# Patient Record
Sex: Male | Born: 1980 | Race: Black or African American | Hispanic: No | Marital: Single | Smoking: Never smoker
Health system: Southern US, Community
[De-identification: ages and names within clinical notes are randomized; demographics above are authoritative.]

## PROBLEM LIST (undated history)

## (undated) DIAGNOSIS — N469 Male infertility, unspecified: Secondary | ICD-10-CM

## (undated) HISTORY — DX: Male infertility, unspecified: N46.9

## (undated) HISTORY — PX: OTHER SURGICAL HISTORY: SHX169

---

## 2001-07-08 ENCOUNTER — Emergency Department (HOSPITAL_COMMUNITY): Admission: EM | Admit: 2001-07-08 | Discharge: 2001-07-09 | Payer: Self-pay | Admitting: Emergency Medicine

## 2005-10-16 ENCOUNTER — Emergency Department (HOSPITAL_COMMUNITY): Admission: EM | Admit: 2005-10-16 | Discharge: 2005-10-16 | Payer: Self-pay | Admitting: Emergency Medicine

## 2007-12-09 IMAGING — CR DG ELBOW COMPLETE 3+V*R*
2 series · 2 of 2 positions shown · non-contrast
Comparison: none

CLINICAL DATA: Fell playing basketball.  Unable to extend.  Soft tissue swelling.  
 RIGHT ELBOW ? 4 VIEW:

[view not recorded (1 of 2)]
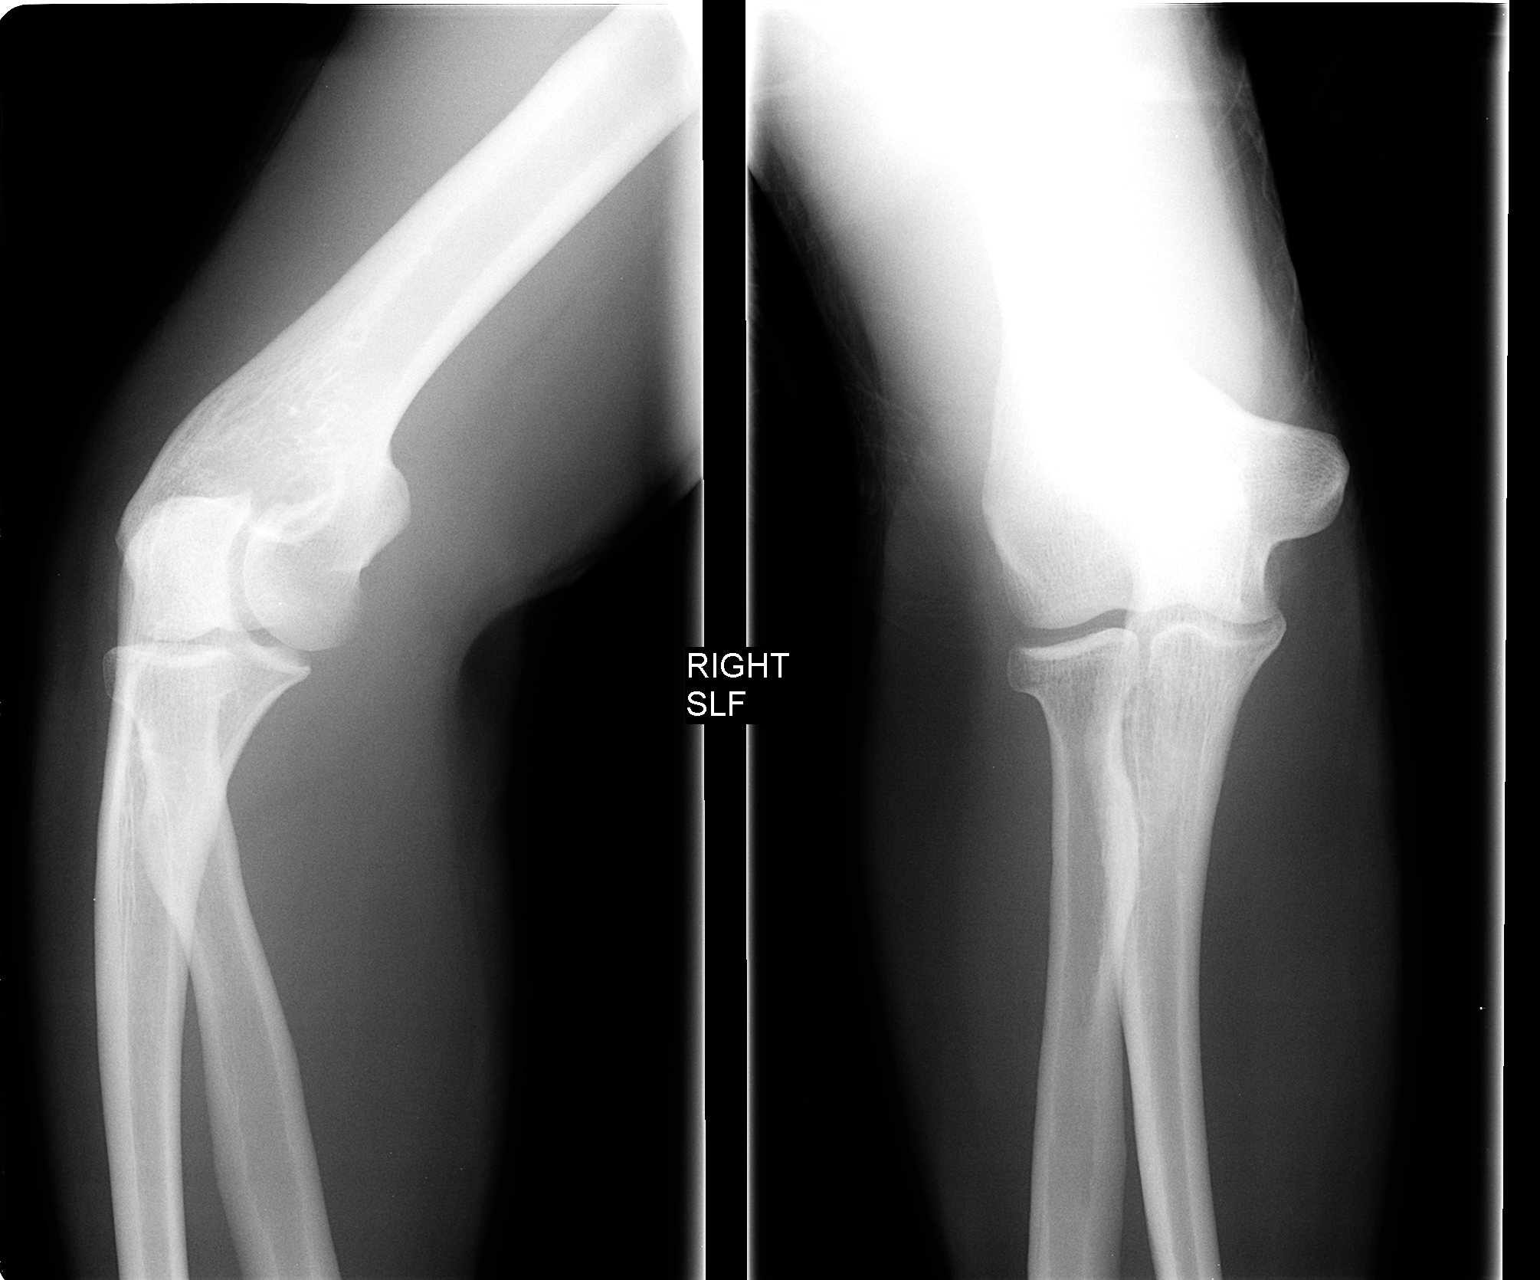

[view not recorded (2 of 2)]
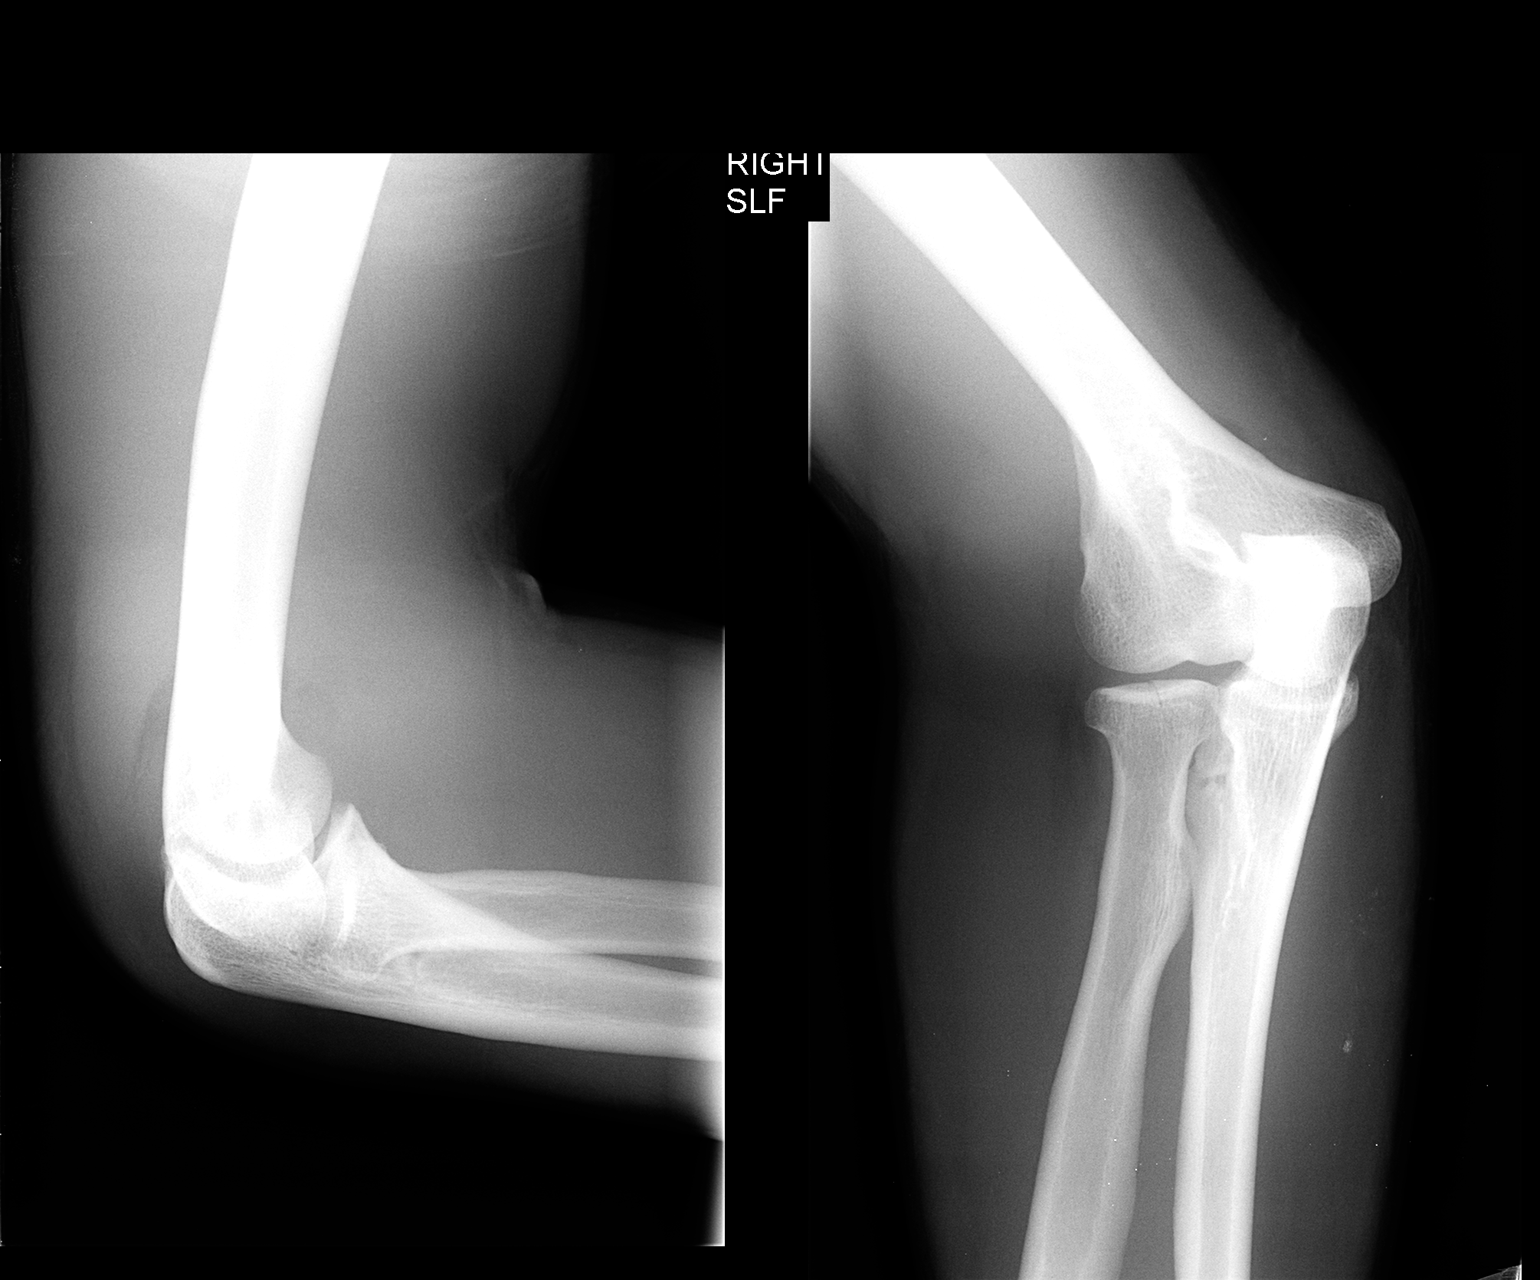

[2 of 2 positions shown; findings below may reference images not displayed]

FINDINGS: There is a nondisplaced radial head fracture observed on the AP view.  There is a large joint effusion with elevation of the fat pads.  Ulna and distal humerus appear intact.
IMPRESSION: Nondisplaced radial head fracture with large joint effusion.

## 2009-09-17 ENCOUNTER — Ambulatory Visit: Payer: Self-pay | Admitting: Family Medicine

## 2009-09-24 ENCOUNTER — Ambulatory Visit: Payer: Self-pay | Admitting: Family Medicine

## 2009-09-24 LAB — CONVERTED CEMR LAB
Bilirubin Urine: NEGATIVE
Blood in Urine, dipstick: NEGATIVE
Ketones, urine, test strip: NEGATIVE
Nitrite: NEGATIVE
Specific Gravity, Urine: <1.005

## 2009-09-25 LAB — CONVERTED CEMR LAB
ALT: 20 units/L (ref 0–53)
BUN: 14 mg/dL (ref 6–23)
Basophils Absolute: 0 10*3/uL (ref 0.0–0.1)
Bilirubin, Direct: 0.1 mg/dL (ref 0.0–0.3)
CO2: 28 meq/L (ref 19–32)
Calcium: 9.5 mg/dL (ref 8.4–10.5)
Chloride: 99 meq/L (ref 96–112)
Cholesterol: 190 mg/dL (ref 0–200)
Creatinine, Ser: 1.1 mg/dL (ref 0.4–1.5)
Eosinophils Absolute: 0.2 10*3/uL (ref 0.0–0.7)
Glucose, Bld: 90 mg/dL (ref 70–99)
HDL: 39.3 mg/dL (ref >39.00)
Lymphocytes Relative: 57.1 % — ABNORMAL HIGH (ref 12.0–46.0)
MCHC: 33.7 g/dL (ref 30.0–36.0)
MCV: 85 fL (ref 78.0–100.0)
Monocytes Absolute: 0.4 10*3/uL (ref 0.1–1.0)
Neutrophils Relative %: 33 % — ABNORMAL LOW (ref 43.0–77.0)
Platelets: 238 10*3/uL (ref 150.0–400.0)
RBC: 4.62 M/uL (ref 4.22–5.81)
RDW: 13.7 % (ref 11.5–14.6)
TSH: 0.89 microintl units/mL (ref 0.35–5.50)
Total Bilirubin: 1 mg/dL (ref 0.3–1.2)
Total CHOL/HDL Ratio: 5
Triglycerides: 56 mg/dL (ref 0.0–149.0)

## 2009-09-27 ENCOUNTER — Telehealth: Payer: Self-pay | Admitting: Family Medicine

## 2009-09-27 ENCOUNTER — Encounter: Payer: Self-pay | Admitting: Family Medicine

## 2010-02-26 NOTE — Progress Notes (Signed)
Summary: letter for insurance  Phone Note Call from Patient   Summary of Call: patient is requesting a letter for his insurance stating that labs are normal and that he should not have to pay a higher premium.  is this okay to write? Initial call taken by: Kern Reap CMA Duncan Dull),  September 27, 2009 2:03 PM  Follow-up for Phone Call        ok Follow-up by: Roderick Pee MD,  September 27, 2009 2:04 PM  Additional Follow-up for Phone Call Additional follow up Details #1::        letter faxed Additional Follow-up by: Kern Reap CMA Duncan Dull),  September 27, 2009 4:02 PM

## 2010-02-26 NOTE — Letter (Signed)
Summary: Generic Letter  [facility]  [address] [place], [place] [address]   Phone: [phone]  Fax: [phone]    09/27/2009  Andrew Brooks [address] [place], [place]  [address]  To Whom it May Concern,   Our patient Andrew Brooks's lab results were normal.  Therefore he should not be charged a higher premium for his life insurance.  If you have any questions or concerns please feel free to call our office.          Sincerely,   Kelle Darting, MD

## 2010-02-26 NOTE — Assessment & Plan Note (Signed)
Summary: NEW PT EST // RS   Vital Signs:  Patient profile:   30 year old male Height:      74 inches Weight:      224 pounds BMI:     28.86 Temp:     98.5 degrees F oral BP sitting:   130 / 80  (left arm) Cuff size:   regular  Vitals Entered By: Kern Reap CMA Duncan Dull) (September 17, 2009 2:02 PM) CC: new to establish care Is Patient Diabetic? No Pain Assessment Patient in pain? no        CC:  new to establish care.  History of Present Illness: Andrew Brooks is a 30 year old single male, who comes in today to get established and to have a general physical examination  He saw his been in excellent health.  He said no chronic health problems.  He states he recently went for a life insurance exam and was radiated because he has a family history of hypertension, diabetes, on his father's side.  His blood pressure and blood sugar have always been normal.  Review of systems negative.  He went to [employer]  high school and then to college at [employer] and graduated with a degree in Nurse, children's.  Currently working for [employer]  Preventive Screening-Counseling & Management  Alcohol-Tobacco     Smoking Status: never  Hep-HIV-STD-Contraception     Dental Visit-last 6 months no      Drug Use:  no.    Allergies (verified): No Known Drug Allergies  Past History:  Past medical, surgical, family and social histories (including risk factors) reviewed, and no changes noted (except as noted below).  Past Medical History: Unremarkable  Past Surgical History: Denies surgical history  Family History: Reviewed history and no changes required. Father: DM, HTN Mother: healthy Siblings: healthy  Social History: Reviewed history and no changes required. Occupation: tech support Single Never Smoked Alcohol use-yes Drug use-no Smoking Status:  never Drug Use:  no Dental Care w/in 6 mos.:  no  Review of Systems      See HPI  Physical Exam  General:   Well-developed,well-nourished,in no acute distress; alert,appropriate and cooperative throughout examination Head:  Normocephalic and atraumatic without obvious abnormalities. No apparent alopecia or balding. Eyes:  No corneal or conjunctival inflammation noted. EOMI. Perrla. Funduscopic exam benign, without hemorrhages, exudates or papilledema. Vision grossly normal. Ears:  External ear exam shows no significant lesions or deformities.  Otoscopic examination reveals clear canals, tympanic membranes are intact bilaterally without bulging, retraction, inflammation or discharge. Hearing is grossly normal bilaterally. Nose:  External nasal examination shows no deformity or inflammation. Nasal mucosa are pink and moist without lesions or exudates. Mouth:  Oral mucosa and oropharynx without lesions or exudates.  Teeth in good repair. Neck:  No deformities, masses, or tenderness noted. Chest Wall:  No deformities, masses, tenderness or gynecomastia noted. Breasts:  No masses or gynecomastia noted Lungs:  Normal respiratory effort, chest expands symmetrically. Lungs are clear to auscultation, no crackles or wheezes. Heart:  Normal rate and regular rhythm. S1 and S2 normal without gallop, murmur, click, rub or other extra sounds. Abdomen:  Bowel sounds positive,abdomen soft and non-tender without masses, organomegaly or hernias noted. Genitalia:  Testes bilaterally descended without nodularity, tenderness or masses. No scrotal masses or lesions. No penis lesions or urethral discharge. Msk:  No deformity or scoliosis noted of thoracic or lumbar spine.   Pulses:  R and L carotid,radial,femoral,dorsalis pedis and posterior tibial pulses are full and equal  bilaterally Extremities:  No clubbing, cyanosis, edema, or deformity noted with normal full range of motion of all joints.   Neurologic:  No cranial nerve deficits noted. Station and gait are normal. Plantar reflexes are down-going bilaterally. DTRs are  symmetrical throughout. Sensory, motor and coordinative functions appear intact. Skin:  Intact without suspicious lesions or rashes Cervical Nodes:  No lymphadenopathy noted Axillary Nodes:  No palpable lymphadenopathy Inguinal Nodes:  No significant adenopathy Psych:  Cognition and judgment appear intact. Alert and cooperative with normal attention span and concentration. No apparent delusions, illusions, hallucinations   Impression & Recommendations:  Problem # 1:  Preventive Health Care (ICD-V70.0) Assessment New  Other Orders: Venipuncture ([address]) TLB-Lipid Panel (80061-LIPID) TLB-BMP (Basic Metabolic Panel-BMET) (80048-METABOL) TLB-CBC Platelet - w/Differential (85025-CBCD) TLB-Hepatic/Liver Function Pnl (80076-HEPATIC) TLB-TSH (Thyroid Stimulating Hormone) (84443-TSH) UA Dipstick w/o Micro (automated)  (81003) Tdap => 73yrs IM ([address]) Admin 1st Vaccine ([address]11)  Patient Instructions: 1)  returning your convenience for fasting blood work we will call you the report. 2)  Your physical examination is normal.  There should be no reason why you should be rated for insurance unless the insurance company is using your family history as a  variable   Immunizations Administered:  Tetanus Vaccine:    Vaccine Type: Tdap    Site: right deltoid    Mfr: GlaxoSmithKline    Dose: 0.5 ml    Route: IM    Given by: Kern Reap CMA (AAMA)    Exp. Date: 11/16/2011    Lot #: [id number]    VIS given: 12/15/06 version given September 17, 2009.    Physician counseled: yes

## 2011-01-22 ENCOUNTER — Ambulatory Visit: Payer: 59 | Admitting: Family Medicine

## 2013-01-24 ENCOUNTER — Ambulatory Visit (INDEPENDENT_AMBULATORY_CARE_PROVIDER_SITE_OTHER): Payer: 59 | Admitting: Family

## 2013-01-24 ENCOUNTER — Encounter: Payer: Self-pay | Admitting: Family

## 2013-01-24 VITALS — BP 138/92 | HR 100 | Temp 101.6°F | Ht 74.0 in | Wt 231.0 lb

## 2013-01-24 DIAGNOSIS — J111 Influenza due to unidentified influenza virus with other respiratory manifestations: Secondary | ICD-10-CM

## 2013-01-24 DIAGNOSIS — R6889 Other general symptoms and signs: Secondary | ICD-10-CM

## 2013-01-24 DIAGNOSIS — IMO0001 Reserved for inherently not codable concepts without codable children: Secondary | ICD-10-CM

## 2013-01-24 DIAGNOSIS — R509 Fever, unspecified: Secondary | ICD-10-CM

## 2013-01-24 LAB — POCT INFLUENZA A/B
Influenza A, POC: POSITIVE
Influenza B, POC: POSITIVE

## 2013-01-24 MED ORDER — HYDROCODONE-HOMATROPINE 5-1.5 MG/5ML PO SYRP: 5.0000 mL | ORAL_SOLUTION | Freq: Three times a day (TID) | ORAL | Status: DC | PRN

## 2013-01-24 NOTE — Progress Notes (Signed)
   Subjective:    Patient ID: Andrew Brooks, male    DOB: 01-Oct-1980, 32 y.o.   MRN: [id number]  HPI 32 year old African American male, nonsmoker patient of Dr. Tawanna Cooler is in today with complaints of cough, congestion, fever, runny nose, body aches x1 day of sudden onset. Continue Mucinex over-the-counter without relief. Patient reports being exposed to the flu.   Review of Systems  Constitutional: Positive for fever and fatigue.  HENT: Positive for congestion, postnasal drip and rhinorrhea.   Respiratory: Positive for cough.   Cardiovascular: Negative.   Gastrointestinal: Negative.   Endocrine: Negative.   Genitourinary: Negative.   Skin: Negative.   Neurological: Negative.   Hematological: Negative.   Psychiatric/Behavioral: Negative.    No past medical history on file.  History   Social History  . Marital Status: Single    Spouse Name: N/A    Number of Children: N/A  . Years of Education: N/A   Occupational History  . Not on file.   Social History Main Topics  . Smoking status: Never Smoker   . Smokeless tobacco: Not on file  . Alcohol Use: Not on file  . Drug Use: Not on file  . Sexual Activity: Not on file   Other Topics Concern  . Not on file   Social History Narrative  . No narrative on file    No past surgical history on file.  No family history on file.  No Known Allergies  No current outpatient prescriptions on file prior to visit.   No current facility-administered medications on file prior to visit.    BP 138/92  Pulse 100  Temp(Src) 101.6 F (38.7 C) (Oral)  Ht 6\' 2"  (1.88 m)  Wt 231 lb (104.781 kg)  BMI 29.65 kg/m2chart     Objective:   Physical Exam  Constitutional: He is oriented to person, place, and time. He appears well-developed and well-nourished.  HENT:  Right Ear: External ear normal.  Left Ear: External ear normal.  Nose: Nose normal.  Mouth/Throat: Oropharynx is clear and moist.  Neck: Normal range of motion. Neck supple.   Cardiovascular: Normal rate, regular rhythm and normal heart sounds.   Pulmonary/Chest: Effort normal and breath sounds normal.  Abdominal: Soft. Bowel sounds are normal.  Musculoskeletal: Normal range of motion.  Neurological: He is alert and oriented to person, place, and time.  Skin: Skin is warm and dry.  Psychiatric: He has a normal mood and affect.          Assessment & Plan:  Assessment: 1. Influenza  2. Fever 3. Myalgias  Plan: Hycodan syrup as needed for cough and aches. Over-the-counter symptomatic treatment for relief. Tylenol and ibuprofen as needed for fever. Patient on the opposite symptoms worsen or persist. Recheck as scheduled, and as needed.

## 2013-01-24 NOTE — Patient Instructions (Signed)

## 2013-01-24 NOTE — Progress Notes (Signed)
Pre visit review using our clinic review tool, if applicable. No additional management support is needed unless otherwise documented below in the visit note. 

## 2015-05-25 ENCOUNTER — Ambulatory Visit (INDEPENDENT_AMBULATORY_CARE_PROVIDER_SITE_OTHER): Payer: BLUE CROSS/BLUE SHIELD | Admitting: Family Medicine

## 2015-05-25 ENCOUNTER — Encounter: Payer: Self-pay | Admitting: Family Medicine

## 2015-05-25 VITALS — BP 122/92 | HR 81 | Temp 98.7°F | Ht 74.0 in | Wt 240.0 lb

## 2015-05-25 DIAGNOSIS — N469 Male infertility, unspecified: Secondary | ICD-10-CM | POA: Insufficient documentation

## 2015-05-25 DIAGNOSIS — IMO0001 Reserved for inherently not codable concepts without codable children: Secondary | ICD-10-CM

## 2015-05-25 DIAGNOSIS — R030 Elevated blood-pressure reading, without diagnosis of hypertension: Secondary | ICD-10-CM

## 2015-05-25 NOTE — Progress Notes (Signed)
  Phone: [phone]  Subjective:  Patient presents today to establish care with me as their new primary care provider. Patient was formerly a patient of Dr. Tawanna Coolerodd. Chief complaint-noted.   See problem oriented charting ROS- No chest pain or shortness of breath. No headache or blurry vision.   The following were reviewed and entered/updated in epic: Past Medical History  Diagnosis Date  . Infertility male    Patient Active Problem List   Diagnosis Date Noted  . Infertility male    Past Surgical History  Procedure Laterality Date  . None      Family History  Problem Relation Age of Onset  . Diabetes Father   . Healthy Mother     Medications- reviewed and updated No current outpatient prescriptions on file.   No current facility-administered medications for this visit.    Allergies-reviewed and updated No Known Allergies  Social History   Social History  . Marital Status: Single    Spouse Name: N/A  . Number of Children: N/A  . Years of Education: N/A   Social History Main Topics  . Smoking status: Never Smoker   . Smokeless tobacco: None  . Alcohol Use: 0.6 oz/week    1 Standard drinks or equivalent per week  . Drug Use: No  . Sexual Activity: Not Asked   Other Topics Concern  . None   Social History Narrative   Family: Single, dating currently. Sexually active with women- monogamous with fiancee for 8 years.    Steffanie RainwaterFiancee has 801 son 36 years old. Lives with to be son and fiancee      Work: Works in [employer]T   College at [employer]      Hobbies: sports- still enjoys watching, former Pharmacist, hospitalathlete professional baseball [employer]   Thinking about restarting GYM    ROS--See HPI   Objective: BP 122/92 mmHg  Pulse 81  Temp(Src) 98.7 F (37.1 C)  Ht 6\' 2"  (1.88 m)  Wt 240 lb (108.863 kg)  BMI 30.80 kg/m2 Gen: NAD, resting comfortably HEENT: Mucous membranes are moist. Oropharynx normal Neck: no thyromegaly CV: RRR no murmurs rubs or gallops Lungs: CTAB no  crackles, wheeze, rhonchi Abdomen: soft/nontender/nondistended/normal bowel sounds. No rebound or guarding.  Ext: no edema Skin: warm, dry Neuro: grossly normal, moves all extremities, PERRLA  Assessment/Plan:  Elevated blood pressure S: Hesitant to label hypertension at this point given unclear circumstances during last reading but patient does have elevated BP at minimum.  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 He is not exercising, drinking excess sugary beverages and has high salt diet. Discussed DASH and starting regular exercise program with follow up for physical in 6 months. Discussed long term risks of high BP. We also dsicussed with family history diabetes his risks if he does not lose weight- fasting lipids and sugar before physical in 6 months  Return in about 6 months (around 11/24/2015) for physical. Return precautions advised.   The duration of face-to-face time during this visit was 25 minutes. Greater than 50% of this time was spent in counseling, explanation of diagnosis, planning of further management, and/or coordination of care.    Tana ConchStephen Hunter, MD

## 2015-05-25 NOTE — Patient Instructions (Addendum)
Blood pressure is too high! Likely due to poor diet and lack of exercise.   BP Readings from Last 3 Encounters:  05/25/15 122/92  01/24/13 138/92  09/17/09 130/80  Goal is for top number to be under 140 and bottom number to be under 90  Wt Readings from Last 3 Encounters:  05/25/15 240 lb (108.863 kg)  01/24/13 231 lb (104.781 kg)  09/17/09 224 lb (101.606 kg)  Over the next 6 months  I want you to lose at least 10 lbs  DASH Eating Plan DASH stands for "Dietary Approaches to Stop Hypertension." The DASH eating plan is a healthy eating plan that has been shown to reduce high blood pressure (hypertension). Additional health benefits may include reducing the risk of type 2 diabetes mellitus, heart disease, and stroke. The DASH eating plan may also help with weight loss. WHAT DO I NEED TO KNOW ABOUT THE DASH EATING PLAN? For the DASH eating plan, you will follow these general guidelines:  Choose foods with a percent daily value for sodium of less than 5% (as listed on the food label).  Use salt-free seasonings or herbs instead of table salt or sea salt.  Check with your health care provider or pharmacist before using salt substitutes.  Eat lower-sodium products, often labeled as "lower sodium" or "no salt added."  Eat fresh foods.  Eat more vegetables, fruits, and low-fat dairy products.  Choose whole grains. Look for the word "whole" as the first word in the ingredient list.  Choose fish and skinless chicken or [place] more often than red meat. Limit fish, poultry, and meat to 6 oz (170 g) each day.  Limit sweets, desserts, sugars, and sugary drinks.  Choose heart-healthy fats.  Limit cheese to 1 oz (28 g) per day.  Eat more home-cooked food and less restaurant, buffet, and fast food.  Limit fried foods.  Cook foods using methods other than frying.  Limit canned vegetables. If you do use them, rinse them well to decrease the sodium.  When eating at a restaurant, ask that  your food be prepared with less salt, or no salt if possible. WHAT FOODS CAN I EAT? Seek help from a dietitian for individual calorie needs. Grains Whole grain or whole wheat bread. Brown rice. Whole grain or whole wheat pasta. Quinoa, bulgur, and whole grain cereals. Low-sodium cereals. Corn or whole wheat flour tortillas. Whole grain cornbread. Whole grain crackers. Low-sodium crackers. Vegetables Fresh or frozen vegetables (raw, steamed, roasted, or grilled). Low-sodium or reduced-sodium tomato and vegetable juices. Low-sodium or reduced-sodium tomato sauce and paste. Low-sodium or reduced-sodium canned vegetables.  Fruits All fresh, canned (in natural juice), or frozen fruits. Meat and Other Protein Products Ground beef (85% or leaner), grass-fed beef, or beef trimmed of fat. Skinless chicken or [place]. Ground chicken or [place]. Pork trimmed of fat. All fish and seafood. Eggs. Dried beans, peas, or lentils. Unsalted nuts and seeds. Unsalted canned beans. Dairy Low-fat dairy products, such as skim or 1% milk, 2% or reduced-fat cheeses, low-fat ricotta or cottage cheese, or plain low-fat yogurt. Low-sodium or reduced-sodium cheeses. Fats and Oils Tub margarines without trans fats. Light or reduced-fat mayonnaise and salad dressings (reduced sodium). Avocado. Safflower, olive, or canola oils. Natural peanut or almond butter. Other Unsalted popcorn and pretzels. The items listed above may not be a complete list of recommended foods or beverages. Contact your dietitian for more options. WHAT FOODS ARE NOT RECOMMENDED? Grains White bread. White pasta. White rice. Refined cornbread. Bagels and  croissants. Crackers that contain trans fat. Vegetables Creamed or fried vegetables. Vegetables in a cheese sauce. Regular canned vegetables. Regular canned tomato sauce and paste. Regular tomato and vegetable juices. Fruits Dried fruits. Canned fruit in light or heavy syrup. Fruit juice. Meat and Other  Protein Products Fatty cuts of meat. Ribs, chicken wings, bacon, sausage, bologna, salami, chitterlings, fatback, hot dogs, bratwurst, and packaged luncheon meats. Salted nuts and seeds. Canned beans with salt. Dairy Whole or 2% milk, cream, half-and-half, and cream cheese. Whole-fat or sweetened yogurt. Full-fat cheeses or blue cheese. Nondairy creamers and whipped toppings. Processed cheese, cheese spreads, or cheese curds. Condiments Onion and garlic salt, seasoned salt, table salt, and sea salt. Canned and packaged gravies. Worcestershire sauce. Tartar sauce. Barbecue sauce. Teriyaki sauce. Soy sauce, including reduced sodium. Steak sauce. Fish sauce. Oyster sauce. Cocktail sauce. Horseradish. Ketchup and mustard. Meat flavorings and tenderizers. Bouillon cubes. Hot sauce. Tabasco sauce. Marinades. Taco seasonings. Relishes. Fats and Oils Butter, stick margarine, lard, shortening, ghee, and bacon fat. Coconut, palm kernel, or palm oils. Regular salad dressings. Other Pickles and olives. Salted popcorn and pretzels. The items listed above may not be a complete list of foods and beverages to avoid. Contact your dietitian for more information. WHERE CAN I FIND MORE INFORMATION? National Heart, Lung, and Blood Institute: CablePromo.itwww.nhlbi.nih.gov/health/health-topics/topics/dash/   This information is not intended to replace advice given to you by your health care provider. Make sure you discuss any questions you have with your health care provider.   Document Released: 01/02/2011 Document Revised: 02/03/2014 Document Reviewed: 11/17/2012 Elsevier Interactive Patient Education [employer]2016 Elsevier Inc.

## 2015-11-16 ENCOUNTER — Other Ambulatory Visit: Payer: BLUE CROSS/BLUE SHIELD

## 2015-11-23 ENCOUNTER — Encounter: Payer: BLUE CROSS/BLUE SHIELD | Admitting: Family Medicine

## 2016-05-15 ENCOUNTER — Encounter: Payer: Self-pay | Admitting: Family Medicine

## 2016-05-15 ENCOUNTER — Ambulatory Visit (INDEPENDENT_AMBULATORY_CARE_PROVIDER_SITE_OTHER): Payer: BLUE CROSS/BLUE SHIELD | Admitting: Family Medicine

## 2016-05-15 VITALS — BP 138/78 | HR 93 | Temp 98.4°F | Ht 74.0 in | Wt 239.4 lb

## 2016-05-15 DIAGNOSIS — B350 Tinea barbae and tinea capitis: Secondary | ICD-10-CM | POA: Diagnosis not present

## 2016-05-15 DIAGNOSIS — I499 Cardiac arrhythmia, unspecified: Secondary | ICD-10-CM

## 2016-05-15 MED ORDER — FLUCONAZOLE 100 MG PO TABS: 100.0000 mg | ORAL_TABLET | Freq: Every day | ORAL | 0 refills | Status: DC

## 2016-05-15 NOTE — Progress Notes (Signed)
Subjective:  Andrew Brooks is a 36 y.o. year old very pleasant male patient who presents for/with See problem oriented charting ROS- some itching in beard. No hair loss. not ill appearing, no fever/chills. No new medications. Not immunocompromised. No mucus membrane involvement.    Past Medical History-  Patient Active Problem List   Diagnosis Date Noted  . Infertility male     Medications- reviewed and updated No current outpatient prescriptions on file.   No current facility-administered medications for this visit.     Objective: BP 138/78 (BP Location: Left Arm, Patient Position: Sitting, Cuff Size: Large)   Pulse 93   Temp 98.4 F (36.9 C) (Oral)   Ht  (1.88 m)   Wt 239 lb 6.4 oz (108.6 kg)   SpO2 96%   BMI 30.74 kg/m  Gen: NAD, resting comfortably CV: RRR other than seems to vary with breathing cycle Lungs: CTAB no crackles, wheeze, rhonchi Ext: no edema Skin: warm, dry, in right side of beard above the chinline patient has at least 2 x 2 cm circular area within the beard with rasied darkened border and central clearing.   EKG: varying rate likely due to sinus arrhythmia (do not agree with atrial fibrillation given p waves before every qrs and no dropped beats so doubt heart block). Rate 96. Normal intervals, no hypertrophy. No st or t wave changes other than inverted in v2.   Assessment/Plan:  Irregular heart rate - Plan: EKG 12-Lead Rash in beard S: irregular heart rate noted by nursing staff and EKG performed. Patient asymptomatic with no chest pain, shortness of breath, palpitations, dizziness.   Patient noted a scaly area in beard a few days ago. Itches mildly. GF looked at it and appears circular. No recent new medications added. Nothing seems to make it better or worse. No fever, does not feel ill. No pain in area A/P: Tinea barbae- considered topical but uptodate review shows when entrentched in beard unlikely to be affected by topics- we opted in the end  for fluconazole  daily for 1 month with follow up 1 month if not improving  Irregular HR due to sinus arhythmia. Report reads atrial fibrillation but no evidence of that on EKG with P waves before every qrs without dropped beats so no heart block either  Orders Placed This Encounter  Procedures  . EKG 12-Lead    Order Specific Question:   Where should this test be performed    Answer:   Other    Meds ordered this encounter  Medications  . fluconazole (DIFLUCAN) 100 MG tablet    Sig: Take 1 tablet (100 mg total) by mouth daily.    Dispense:  30 tablet    Refill:  0   Return precautions advised.  Tana Conch, MD

## 2016-05-15 NOTE — Progress Notes (Signed)
Pre visit review using our clinic review tool, if applicable. No additional management support is needed unless otherwise documented below in the visit note. 

## 2016-05-15 NOTE — Patient Instructions (Signed)
Fluconazole daily for next month. Sometimes 2 months are needed so lets reassess in 1 month if not resolved   Body Ringworm- actually yours is in the beard called tinea barbae and need to treat with oral medicine instead of topical Body ringworm is an infection of the skin that often causes a ring-shaped rash. Body ringworm can affect any part of your skin. It can spread easily to others. Body ringworm is also called tinea corporis. What are the causes? This condition is caused by funguses called dermatophytes. The condition develops when these funguses grow out of control on the skin. You can get this condition if you touch a person or animal that has it. You can also get it if you share clothing, bedding, towels, or any other object with an infected person or pet. What increases the risk? This condition is more likely to develop in:  Athletes who often make skin-to-skin contact with other athletes, such as wrestlers.  People who share equipment and mats.  People with a weakened immune system. What are the signs or symptoms? Symptoms of this condition include:  Itchy, raised red spots and bumps.  Red scaly patches.  A ring-shaped rash. The rash may have:  A clear center.  Scales or red bumps at its center.  Redness near its borders.  Dry and scaly skin on or around it. How is this diagnosed? This condition can usually be diagnosed with a skin exam. A skin scraping may be taken from the affected area and examined under a microscope to see if the fungus is present. How is this treated? This condition may be treated with:  An antifungal cream or ointment.  An antifungal shampoo.  Antifungal medicines. These may be prescribed if your ringworm is severe, keeps coming back, or lasts a long time. Follow these instructions at home:  Take over-the-counter and prescription medicines only as told by your health care provider.  If you were given an antifungal cream or  ointment:  Use it as told by your health care provider.  Wash the infected area and dry it completely before applying the cream or ointment.  If you were given an antifungal shampoo:  Use it as told by your health care provider.  Leave the shampoo on your body for 3-5 minutes before rinsing.  While you have a rash:  Wear loose clothing to stop clothes from rubbing and irritating it.  Wash or change your bed sheets every night.  If your pet has the same infection, take your pet to see a International aid/development worker. How is this prevented?  Practice good hygiene.  Wear sandals or shoes in public places and showers.  Do not share personal items with others.  Avoid touching red patches of skin on other people.  Avoid touching pets that have bald spots.  If you touch an animal that has a bald spot, wash your hands. Contact a health care provider if:  Your rash continues to spread after 7 days of treatment.  Your rash is not gone in 4 weeks.  The area around your rash gets red, warm, tender, and swollen. This information is not intended to replace advice given to you by your health care provider. Make sure you discuss any questions you have with your health care provider. Document Released: 01/11/2000 Document Revised: 06/21/2015 Document Reviewed: 11/09/2014 Elsevier Interactive Patient Education  2017 [employer].

## 2017-08-10 ENCOUNTER — Ambulatory Visit (INDEPENDENT_AMBULATORY_CARE_PROVIDER_SITE_OTHER): Payer: Managed Care, Other (non HMO) | Admitting: Family Medicine

## 2017-08-10 ENCOUNTER — Encounter: Payer: Self-pay | Admitting: Family Medicine

## 2017-08-10 VITALS — BP 128/88 | HR 89 | Temp 97.9°F | Ht 74.0 in | Wt 241.8 lb

## 2017-08-10 DIAGNOSIS — R05 Cough: Secondary | ICD-10-CM | POA: Diagnosis not present

## 2017-08-10 DIAGNOSIS — R058 Other specified cough: Secondary | ICD-10-CM

## 2017-08-10 MED ORDER — PREDNISONE 20 MG PO TABS: ORAL_TABLET | ORAL | 0 refills | Status: DC

## 2017-08-10 NOTE — Patient Instructions (Addendum)
Lets try low dose prednisone for what seems like post viral cough.  If you are not getting better in 2-3 weeks see us back so we can do an x-ray. Another thing you could do if prednisone doesn't knock it out would be try claritin and flonase in combo over the counter.

## 2017-08-10 NOTE — Progress Notes (Signed)
Subjective:  Andrew Brooks is a 37 y.o. year old very pleasant male patient who presents for/with See problem oriented charting ROS- no fever, chills, shortness of breath, chest pain, does have cough    Past Medical History-  Patient Active Problem List   Diagnosis Date Noted  . Infertility male     Medications- reviewed and updated Current Outpatient Medications  Medication Sig Dispense Refill  . predniSONE (DELTASONE) 20 MG tablet Take 1 tablet by mouth daily for 5 days, then 1/2 tablet daily for 2 days 6 tablet 0   No current facility-administered medications for this visit.     Objective: BP 128/88 (BP Location: Left Arm, Patient Position: Sitting, Cuff Size: Normal)   Pulse 89   Temp 97.9 F (36.6 C) (Oral)   Ht 6\' 2"  (1.88 m)   Wt 241 lb 12.8 oz (109.7 kg)   SpO2 97%   BMI 31.05 kg/m  Gen: NAD, resting comfortably TM normal, oropharynx normal, turbinates erythematous with slight yellow discharge CV: RRR no murmurs rubs or gallops Lungs: CTAB no crackles, wheeze, rhonchi Abdomen: soft/nontender Ext: no edema Skin: warm, dry  Assessment/Plan:  Cough  S: patient with cough for 4-6 weeks.   History of seasonal allergies around this time of year. Started with a little sinus situation. Took some cough medicine and was out of work for a few days and got over it but had lingering cough afterwords that wouldn't go away. Coughs randomly throughout the day. Worse around mid day and then flares up again at night. Took a whole bottle of delysm and didn't help much. Had watery itchy eyes, runny nose for a bit but that has improved. No fever or chills. No shortness of breath. Not on any allergy medicine currently. Not coughing up anything- dry. Feels itch in throat. No current meds.  A/P: strongly suspect post viral cough. Could also be allergies. Prednisone likely to help both From avs  "Lets try low dose prednisone for what seems like post viral cough.  If you are not getting  better in 2-3 weeks see us back so we can do an x-ray. Another thing you could do if prednisone doesn't knock it out would be try claritin and flonase in combo over the counter. "  Meds ordered this encounter  Medications  . predniSONE (DELTASONE) 20 MG tablet    Sig: Take 1 tablet by mouth daily for 5 days, then 1/2 tablet daily for 2 days    Dispense:  6 tablet    Refill:  0   Return precautions advised.  Tana ConchStephen Vaibhav Fogleman, MD

## 2020-05-14 ENCOUNTER — Other Ambulatory Visit: Payer: Self-pay

## 2020-05-14 ENCOUNTER — Emergency Department (HOSPITAL_BASED_OUTPATIENT_CLINIC_OR_DEPARTMENT_OTHER)
Admission: EM | Admit: 2020-05-14 | Discharge: 2020-05-14 | Disposition: A | Discharge status: Home / Self Care | Payer: BC Managed Care – PPO

## 2020-06-12 ENCOUNTER — Encounter: Payer: Self-pay | Admitting: Family Medicine

## 2020-06-12 ENCOUNTER — Other Ambulatory Visit: Payer: Self-pay

## 2020-06-12 ENCOUNTER — Ambulatory Visit (INDEPENDENT_AMBULATORY_CARE_PROVIDER_SITE_OTHER): Payer: BC Managed Care – PPO | Admitting: Family Medicine

## 2020-06-12 VITALS — BP 158/92 | HR 82 | Temp 97.7°F | Ht 74.0 in | Wt 257.0 lb

## 2020-06-12 DIAGNOSIS — E785 Hyperlipidemia, unspecified: Secondary | ICD-10-CM

## 2020-06-12 DIAGNOSIS — Z23 Encounter for immunization: Secondary | ICD-10-CM | POA: Diagnosis not present

## 2020-06-12 DIAGNOSIS — Z114 Encounter for screening for human immunodeficiency virus [HIV]: Secondary | ICD-10-CM | POA: Diagnosis not present

## 2020-06-12 DIAGNOSIS — Z0000 Encounter for general adult medical examination without abnormal findings: Secondary | ICD-10-CM

## 2020-06-12 DIAGNOSIS — Z1159 Encounter for screening for other viral diseases: Secondary | ICD-10-CM

## 2020-06-12 LAB — CBC WITH DIFFERENTIAL/PLATELET
Basophils Absolute: 0 10*3/uL (ref 0.0–0.1)
Basophils Relative: 0.1 % (ref 0.0–3.0)
Eosinophils Absolute: 0.1 10*3/uL (ref 0.0–0.7)
Eosinophils Relative: 1.2 % (ref 0.0–5.0)
HCT: 40.4 % (ref 39.0–52.0)
Hemoglobin: 13.4 g/dL (ref 13.0–17.0)
Lymphocytes Relative: 53.4 % — ABNORMAL HIGH (ref 12.0–46.0)
Lymphs Abs: 4.5 10*3/uL — ABNORMAL HIGH (ref 0.7–4.0)
MCHC: 33.2 g/dL (ref 30.0–36.0)
MCV: 84.5 fl (ref 78.0–100.0)
Monocytes Absolute: 0.5 10*3/uL (ref 0.1–1.0)
Monocytes Relative: 6.5 % (ref 3.0–12.0)
Neutro Abs: 3.3 10*3/uL (ref 1.4–7.7)
Neutrophils Relative %: 38.8 % — ABNORMAL LOW (ref 43.0–77.0)
Platelets: 276 10*3/uL (ref 150.0–400.0)
RBC: 4.78 Mil/uL (ref 4.22–5.81)
RDW: 14.3 % (ref 11.5–15.5)
WBC: 8.4 10*3/uL (ref 4.0–10.5)

## 2020-06-12 LAB — COMPREHENSIVE METABOLIC PANEL
ALT: 36 U/L (ref 0–53)
AST: 21 U/L (ref 0–37)
Albumin: 4.7 g/dL (ref 3.5–5.2)
Alkaline Phosphatase: 52 U/L (ref 39–117)
BUN: 16 mg/dL (ref 6–23)
CO2: 29 mEq/L (ref 19–32)
Calcium: 9.5 mg/dL (ref 8.4–10.5)
Chloride: 99 mEq/L (ref 96–112)
Creatinine, Ser: 1.19 mg/dL (ref 0.40–1.50)
GFR: 76.17 mL/min (ref >60.00)
Glucose, Bld: 125 mg/dL — ABNORMAL HIGH (ref 70–99)
Potassium: 3.6 mEq/L (ref 3.5–5.1)
Sodium: 137 mEq/L (ref 135–145)
Total Bilirubin: 0.7 mg/dL (ref 0.2–1.2)
Total Protein: 7.7 g/dL (ref 6.0–8.3)

## 2020-06-12 LAB — LIPID PANEL
Cholesterol: 262 mg/dL — ABNORMAL HIGH (ref 0–200)
HDL: 47.1 mg/dL (ref >39.00)
LDL Cholesterol: 189 mg/dL — ABNORMAL HIGH (ref 0–99)
NonHDL: 215.01
Total CHOL/HDL Ratio: 6
Triglycerides: 128 mg/dL (ref 0.0–149.0)
VLDL: 25.6 mg/dL (ref 0.0–40.0)

## 2020-06-12 LAB — TSH: TSH: 1.63 u[IU]/mL (ref 0.35–4.50)

## 2020-06-12 NOTE — Patient Instructions (Addendum)
Health Maintenance Due  Topic Date Due  . HIV Screening Done today. Never done  . Hepatitis C Screening Done today.  Never done  . TETANUS/TDAP Done today.  09/18/2019    Please stop by lab before you go If you have mychart- we will send your results within 3 business days of [place] receiving them.  If you do not have mychart- we will call you about results within 5 business days of [place] receiving them.  *please also note that you will see labs on mychart as soon as they post. I will later go in and write notes on them- will say "notes from Dr. Durene Cal"  Your blood pressure trend concerns me. I would like for you to buy/use a home cuff to check at least 4x a week. Your goal is <135/85.  Bring your home cuff and your log of blood pressures with you to next visit. Can update me by mychart in 2 weeks  If cough persists we can try prednisone though would prefer for BP to be a little lower perhaps <155/90  Debrox or Mineral oil for ear full of wax on the right side Purchase mineral oil from laxative aisle Lay down on your side with ear that is bothering you facing up Use 3-4 drops with a dropper and place in ear for 30 seconds Place cotton swab outside of ear Turn to other side and allow this to drain Repeat 3-4 x a day Return to see [place] if not improving within a few days    Recommended follow up: Return in about 1 month (around 07/13/2020) for follow up- or sooner if needed. if home blood pressure not at goal- otherwise 1 year is ok   https://www.mata.com/.pdf">  DASH Eating Plan DASH stands for Dietary Approaches to Stop Hypertension. The DASH eating plan is a healthy eating plan that has been shown to:  Reduce high blood pressure (hypertension).  Reduce your risk for type 2 diabetes, heart disease, and stroke.  Help with weight loss. What are tips for following this plan? Reading food labels  Check food labels for the amount of salt (sodium) per  serving. Choose foods with less than 5 percent of the Daily Value of sodium. Generally, foods with less than 300 milligrams (mg) of sodium per serving fit into this eating plan.  To find whole grains, look for the word "whole" as the first word in the ingredient list. Shopping  Buy products labeled as "low-sodium" or "no salt added."  Buy fresh foods. Avoid canned foods and pre-made or frozen meals. Cooking  Avoid adding salt when cooking. Use salt-free seasonings or herbs instead of table salt or sea salt. Check with your health care provider or pharmacist before using salt substitutes.  Do not fry foods. Cook foods using healthy methods such as baking, boiling, grilling, roasting, and broiling instead.  Cook with heart-healthy oils, such as olive, canola, avocado, soybean, or sunflower oil. Meal planning  Eat a balanced diet that includes: ? 4 or more servings of fruits and 4 or more servings of vegetables each day. Try to fill one-half of your plate with fruits and vegetables. ? 6-8 servings of whole grains each day. ? Less than 6 oz (170 g) of lean meat, poultry, or fish each day. A 3-oz (85-g) serving of meat is about the same size as a deck of cards. One egg equals 1 oz (28 g). ? 2-3 servings of low-fat dairy each day. One serving is 1 cup (237 mL). ?  1 serving of nuts, seeds, or beans 5 times each week. ? 2-3 servings of heart-healthy fats. Healthy fats called omega-3 fatty acids are found in foods such as walnuts, flaxseeds, fortified milks, and eggs. These fats are also found in cold-water fish, such as sardines, salmon, and mackerel.  Limit how much you eat of: ? Canned or prepackaged foods. ? Food that is high in trans fat, such as some fried foods. ? Food that is high in saturated fat, such as fatty meat. ? Desserts and other sweets, sugary drinks, and other foods with added sugar. ? Full-fat dairy products.  Do not salt foods before eating.  Do not eat more than 4 egg  yolks a week.  Try to eat at least 2 vegetarian meals a week.  Eat more home-cooked food and less restaurant, buffet, and fast food.   Lifestyle  When eating at a restaurant, ask that your food be prepared with less salt or no salt, if possible.  If you drink alcohol: ? Limit how much you use to:  0-1 drink a day for women who are not pregnant.  0-2 drinks a day for men. ? Be aware of how much alcohol is in your drink. In the [place] one drink equals one 12 oz bottle of beer (355 mL), one 5 oz glass of wine (148 mL), or one 1 oz glass of hard liquor (44 mL). General information  Avoid eating more than 2,300 mg of salt a day. If you have hypertension, you may need to reduce your sodium intake to 1,500 mg a day.  Work with your health care provider to maintain a healthy body weight or to lose weight. Ask what an ideal weight is for you.  Get at least 30 minutes of exercise that causes your heart to beat faster (aerobic exercise) most days of the week. Activities may include walking, swimming, or biking.  Work with your health care provider or dietitian to adjust your eating plan to your individual calorie needs. What foods should I eat? Fruits All fresh, dried, or frozen fruit. Canned fruit in natural juice (without added sugar). Vegetables Fresh or frozen vegetables (raw, steamed, roasted, or grilled). Low-sodium or reduced-sodium tomato and vegetable juice. Low-sodium or reduced-sodium tomato sauce and tomato paste. Low-sodium or reduced-sodium canned vegetables. Grains Whole-grain or whole-wheat bread. Whole-grain or whole-wheat pasta. Brown rice. Orpah Cobb. Bulgur. Whole-grain and low-sodium cereals. Pita bread. Low-fat, low-sodium crackers. Whole-wheat flour tortillas. Meats and other proteins Skinless chicken or [place]. Ground chicken or [place]. Pork with fat trimmed off. Fish and seafood. Egg whites. Dried beans, peas, or lentils. Unsalted nuts, nut butters, and seeds.  Unsalted canned beans. Lean cuts of beef with fat trimmed off. Low-sodium, lean precooked or cured meat, such as sausages or meat loaves. Dairy Low-fat (1%) or fat-free (skim) milk. Reduced-fat, low-fat, or fat-free cheeses. Nonfat, low-sodium ricotta or cottage cheese. Low-fat or nonfat yogurt. Low-fat, low-sodium cheese. Fats and oils Soft margarine without trans fats. Vegetable oil. Reduced-fat, low-fat, or light mayonnaise and salad dressings (reduced-sodium). Canola, safflower, olive, avocado, soybean, and sunflower oils. Avocado. Seasonings and condiments Herbs. Spices. Seasoning mixes without salt. Other foods Unsalted popcorn and pretzels. Fat-free sweets. The items listed above may not be a complete list of foods and beverages you can eat. Contact a dietitian for more information. What foods should I avoid? Fruits Canned fruit in a light or heavy syrup. Fried fruit. Fruit in cream or butter sauce. Vegetables Creamed or fried vegetables. Vegetables in a  cheese sauce. Regular canned vegetables (not low-sodium or reduced-sodium). Regular canned tomato sauce and paste (not low-sodium or reduced-sodium). Regular tomato and vegetable juice (not low-sodium or reduced-sodium). Rosita Fire. Olives. Grains Baked goods made with fat, such as croissants, muffins, or some breads. Dry pasta or rice meal packs. Meats and other proteins Fatty cuts of meat. Ribs. Fried meat. Tomasa Blase. Bologna, salami, and other precooked or cured meats, such as sausages or meat loaves. Fat from the back of a pig (fatback). Bratwurst. Salted nuts and seeds. Canned beans with added salt. Canned or smoked fish. Whole eggs or egg yolks. Chicken or [place] with skin. Dairy Whole or 2% milk, cream, and half-and-half. Whole or full-fat cream cheese. Whole-fat or sweetened yogurt. Full-fat cheese. Nondairy creamers. Whipped toppings. Processed cheese and cheese spreads. Fats and oils Butter. Stick margarine. Lard. Shortening. Ghee.  Bacon fat. Tropical oils, such as coconut, palm kernel, or palm oil. Seasonings and condiments Onion salt, garlic salt, seasoned salt, table salt, and sea salt. Worcestershire sauce. Tartar sauce. Barbecue sauce. Teriyaki sauce. Soy sauce, including reduced-sodium. Steak sauce. Canned and packaged gravies. Fish sauce. Oyster sauce. Cocktail sauce. Store-bought horseradish. Ketchup. Mustard. Meat flavorings and tenderizers. Bouillon cubes. Hot sauces. Pre-made or packaged marinades. Pre-made or packaged taco seasonings. Relishes. Regular salad dressings. Other foods Salted popcorn and pretzels. The items listed above may not be a complete list of foods and beverages you should avoid. Contact a dietitian for more information. Where to find more information  National Heart, Lung, and Blood Institute: PopSteam.is  American Heart Association: www.heart.org  Academy of Nutrition and Dietetics: www.eatright.org  National Kidney Foundation: www.kidney.org Summary  The DASH eating plan is a healthy eating plan that has been shown to reduce high blood pressure (hypertension). It may also reduce your risk for type 2 diabetes, heart disease, and stroke.  When on the DASH eating plan, aim to eat more fresh fruits and vegetables, whole grains, lean proteins, low-fat dairy, and heart-healthy fats.  With the DASH eating plan, you should limit salt (sodium) intake to 2,300 mg a day. If you have hypertension, you may need to reduce your sodium intake to 1,500 mg a day.  Work with your health care provider or dietitian to adjust your eating plan to your individual calorie needs. This information is not intended to replace advice given to you by your health care provider. Make sure you discuss any questions you have with your health care provider. Document Revised: 12/17/2018 Document Reviewed: 12/17/2018 Elsevier Patient Education  2021 [employer].

## 2020-06-12 NOTE — Progress Notes (Signed)
Phone: [phone]    Subjective:  Patient presents today for their annual physical. Chief complaint-noted.   See problem oriented charting- ROS- full  review of systems was completed and negative  except for: cough   The following were reviewed and entered/updated in epic: Past Medical History:  Diagnosis Date  . Infertility male    Patient Active Problem List   Diagnosis Date Noted  . Infertility male    Past Surgical History:  Procedure Laterality Date  . none      Family History  Problem Relation Age of Onset  . Diabetes Father   . Healthy Mother     Medications- reviewed and updated No current outpatient medications on file.   No current facility-administered medications for this visit.    Allergies-reviewed and updated No Known Allergies  Social History   Social History Narrative   Family:. Sexually active with women- monogamous with fiancee for 8 years.    Steffanie Rainwater has 45 son 5 years old in 2022. Lives with to be son and fiancee      Work: Works in Consulting civil engineer- mortgage with Scientist, research (life sciences) at [employer]: sports- still enjoys watching, former Pharmacist, hospital baseball [place]      Objective:  BP (!) 158/92   Pulse 82   Temp 97.7 F (36.5 C) (Temporal)   Ht 6\' 2"  (1.88 m)   Wt 257 lb (116.6 kg)   SpO2 97%   BMI 33.00 kg/m  Gen: NAD, resting comfortably HEENT: Mucous membranes are moist. Oropharynx normal, TM obstructed by cerumen on right ear - patient is not bothered by this and declines intervention though info given on avs, left ear clear, nasal turbinates and slightly edeminous Neck: no thyromegaly, no lymphadenopathy CV: RRR no murmurs rubs or gallops, no carotid bruit Lungs: CTAB no crackles, wheeze, rhonchi Abdomen: soft/nontender/nondistended/normal bowel sounds. No rebound or guarding.  Ext: no edema Skin: warm, dry Neuro: grossly normal, moves all extremities, PERRLA     Assessment and Plan:  40 y.o. male presenting for  annual physical.  Health Maintenance counseling: 1. Anticipatory guidance: Patient counseled regarding regular dental exams q6 months, eye exams yearly,  avoiding smoking and second hand smoke , limiting alcohol to 2-3 beverages per week.   2. Risk factor reduction:  Advised patient of need for regular exercise and diet rich and fruits and vegetables to reduce risk of heart attack and stroke. Exercise-He plans to workout 2-3 days a week.He says he has been active intermittently but has gaps at time Diet-reasonably healthy. He says he has cut out the fried foods but does use a lot of seasonings that contain salt. Discussed reducing portion sizes or using tool like myfitness pal Wt Readings from Last 3 Encounters:  06/12/20 257 lb (116.6 kg)  08/10/17 241 lb 12.8 oz (109.7 kg)  05/15/16 239 lb 6.4 oz (108.6 kg)  3. Immunizations/screenings/ancillary studies- discussed Tdap today, discussed hepatitis C and HIV screening--screening done today. Immunization History  Administered Date(s) Administered  . PFIZER(Purple Top)SARS-COV-2 Vaccination 08/12/2019, 09/05/2019  . Td 09/17/2009  4. Prostate cancer screening- no family history, start at age 8  5. Colon cancer screening - no family history, start at age 55 6. Skin cancer screening/prevention- advised regular sunscreen use. Denies worrisome, changing, or new skin lesions.  7. Testicular cancer screening- advised monthly self exams  8. STD screening- patient opts/ monogamoous 9.never smoker  Status of chronic or acute concerns   # cough S:  Patient with a viral infection about a month ago went to emergency room but was not seen due to waiting for about two hours. A similar thing happened in 2019 . Fever resolved within a few days.  Symptoms are improving. Vaccinated from covid 19 x2. No PCR test. He did a home test for COVID- and it came back negative.A/P:post viral coguh noted- improving after infection last month. He was given Prednisone in the  past -we discussed to prescribe him with the medication-He would like to think this over before proceeding.   From AVS:  " If cough persists we can try prednisone though would prefer for BP to be a little lower perhaps <155/90  "  #Elevated blood pressure reading S: medication: none. Home readings #s: does not check his BP at home. BP Readings from Last 3 Encounters:  06/12/20 (!) 158/92  08/10/17 128/88  05/15/16 138/78  A/P: Poor control blood pressure today-recommended Dash eating plan and regular exercise.  Update blood work  From AVS " Your blood pressure trend concerns me. I would like for you to buy/use a home cuff to check at least 4x a week. Your goal is <135/85.  Bring your home cuff and your log of blood pressures with you to next visit. Can update me by mychart in 2 weeks"   #hyperlipidemia S: Medication: None Lab Results  Component Value Date   CHOL 190 09/24/2009   HDL 39.30 09/24/2009   LDLCALC 140 (H) 09/24/2009   TRIG 56.0 09/24/2009   CHOLHDL 5 09/24/2009   A/P: Mild elevations in lipids in the past-update lipid panel, CBC, CMP today   Recommended follow up: Return in about 1 month (around 07/13/2020) for follow up- or sooner if needed. if home blood pressure not at goal- otherwise 1 year is ok  Lab/Order associations: fasting   ICD-10-CM   1. Preventative health care  Z00.00 CBC with Differential/Platelet    Comprehensive metabolic panel    Lipid panel    HIV Antibody (routine testing w rflx)    Hepatitis C antibody    TSH  2. Hyperlipidemia, unspecified hyperlipidemia type  E78.5 CBC with Differential/Platelet    Comprehensive metabolic panel    Lipid panel    TSH  3. Encounter for hepatitis C screening test for low risk patient  Z11.59 Hepatitis C antibody  4. Screening for HIV (human immunodeficiency virus)  Z11.4 HIV Antibody (routine testing w rflx)   I,Harris Phan,acting as a scribe for Tana Conch, MD.,have documented all relevant  documentation on the behalf of Tana Conch, MD,as directed by  Tana Conch, MD while in the presence of Tana Conch, MD.   I, Tana Conch, MD, have reviewed all documentation for this visit. The documentation on 06/12/20 for the exam, diagnosis, procedures, and orders are all accurate and complete.   Return precautions advised.   Tana Conch, MD

## 2020-06-12 NOTE — Addendum Note (Signed)
Addended by: Daryll Brod on: 06/12/2020 12:10 PM   Modules accepted: Orders

## 2020-06-13 LAB — HEPATITIS C ANTIBODY
Hepatitis C Ab: NONREACTIVE
SIGNAL TO CUT-OFF: 0.03 (ref <1.00)

## 2020-06-13 LAB — HIV ANTIBODY (ROUTINE TESTING W REFLEX): HIV 1&2 Ab, 4th Generation: NONREACTIVE

## 2021-10-21 ENCOUNTER — Encounter: Payer: Self-pay | Admitting: *Deleted

## 2022-01-09 ENCOUNTER — Encounter: Payer: Self-pay | Admitting: *Deleted

## 2022-03-10 ENCOUNTER — Ambulatory Visit (INDEPENDENT_AMBULATORY_CARE_PROVIDER_SITE_OTHER): Payer: No Typology Code available for payment source | Admitting: Family Medicine

## 2022-03-10 ENCOUNTER — Encounter: Payer: Self-pay | Admitting: Family Medicine

## 2022-03-10 ENCOUNTER — Other Ambulatory Visit: Payer: Self-pay | Admitting: Family Medicine

## 2022-03-10 VITALS — BP 128/88 | HR 92 | Temp 97.9°F | Ht 74.0 in | Wt 220.8 lb

## 2022-03-10 DIAGNOSIS — Z0000 Encounter for general adult medical examination without abnormal findings: Secondary | ICD-10-CM

## 2022-03-10 DIAGNOSIS — E785 Hyperlipidemia, unspecified: Secondary | ICD-10-CM

## 2022-03-10 DIAGNOSIS — R739 Hyperglycemia, unspecified: Secondary | ICD-10-CM

## 2022-03-10 DIAGNOSIS — E1165 Type 2 diabetes mellitus with hyperglycemia: Secondary | ICD-10-CM

## 2022-03-10 LAB — URINALYSIS, ROUTINE W REFLEX MICROSCOPIC
Bilirubin Urine: NEGATIVE
Ketones, ur: 40 — AB
Leukocytes,Ua: NEGATIVE
Nitrite: NEGATIVE
Specific Gravity, Urine: 1.025 (ref 1.000–1.030)
Total Protein, Urine: NEGATIVE
Urine Glucose: >=1000 — AB
Urobilinogen, UA: 0.2 (ref 0.0–1.0)
pH: 6 (ref 5.0–8.0)

## 2022-03-10 LAB — CBC WITH DIFFERENTIAL/PLATELET
Basophils Absolute: 0 10*3/uL (ref 0.0–0.1)
Basophils Relative: 0.5 % (ref 0.0–3.0)
Eosinophils Absolute: 0 10*3/uL (ref 0.0–0.7)
Eosinophils Relative: 0.9 % (ref 0.0–5.0)
HCT: 43.4 % (ref 39.0–52.0)
Hemoglobin: 14.5 g/dL (ref 13.0–17.0)
Lymphocytes Relative: 47.7 % — ABNORMAL HIGH (ref 12.0–46.0)
Lymphs Abs: 2.2 10*3/uL (ref 0.7–4.0)
MCHC: 33.4 g/dL (ref 30.0–36.0)
MCV: 83.4 fl (ref 78.0–100.0)
Monocytes Absolute: 0.2 10*3/uL (ref 0.1–1.0)
Monocytes Relative: 5 % (ref 3.0–12.0)
Neutro Abs: 2.2 10*3/uL (ref 1.4–7.7)
Neutrophils Relative %: 45.9 % (ref 43.0–77.0)
Platelets: 279 10*3/uL (ref 150.0–400.0)
RBC: 5.21 Mil/uL (ref 4.22–5.81)
RDW: 13.6 % (ref 11.5–15.5)
WBC: 4.7 10*3/uL (ref 4.0–10.5)

## 2022-03-10 LAB — COMPREHENSIVE METABOLIC PANEL
ALT: 21 U/L (ref 0–53)
AST: 19 U/L (ref 0–37)
Albumin: 4.6 g/dL (ref 3.5–5.2)
Alkaline Phosphatase: 74 U/L (ref 39–117)
BUN: 12 mg/dL (ref 6–23)
CO2: 28 mEq/L (ref 19–32)
Calcium: 10 mg/dL (ref 8.4–10.5)
Chloride: 96 mEq/L (ref 96–112)
Creatinine, Ser: 1.26 mg/dL (ref 0.40–1.50)
GFR: 70.26 mL/min (ref >60.00)
Glucose, Bld: 278 mg/dL — ABNORMAL HIGH (ref 70–99)
Potassium: 4.1 mEq/L (ref 3.5–5.1)
Sodium: 134 mEq/L — ABNORMAL LOW (ref 135–145)
Total Bilirubin: 1.3 mg/dL — ABNORMAL HIGH (ref 0.2–1.2)
Total Protein: 7.2 g/dL (ref 6.0–8.3)

## 2022-03-10 LAB — LIPID PANEL
Cholesterol: 353 mg/dL — ABNORMAL HIGH (ref 0–200)
HDL: 39.8 mg/dL (ref >39.00)
LDL Cholesterol: 289 mg/dL — ABNORMAL HIGH (ref 0–99)
NonHDL: 312.93
Total CHOL/HDL Ratio: 9
Triglycerides: 122 mg/dL (ref 0.0–149.0)
VLDL: 24.4 mg/dL (ref 0.0–40.0)

## 2022-03-10 LAB — HEMOGLOBIN A1C: Hgb A1c MFr Bld: 15.4 % — ABNORMAL HIGH (ref 4.6–6.5)

## 2022-03-10 MED ORDER — NOVOFINE PLUS PEN NEEDLE 32G X 4 MM MISC: 1.0000 | Freq: Every day | 5 refills | Status: DC

## 2022-03-10 MED ORDER — TRESIBA FLEXTOUCH 100 UNIT/ML ~~LOC~~ SOPN: 10.0000 [IU] | PEN_INJECTOR | Freq: Every day | SUBCUTANEOUS | 2 refills | Status: DC

## 2022-03-10 MED ORDER — LANCET DEVICE MISC: 1.0000 | Freq: Three times a day (TID) | 0 refills | Status: AC

## 2022-03-10 MED ORDER — BLOOD GLUCOSE TEST VI STRP: 1.0000 | ORAL_STRIP | Freq: Three times a day (TID) | 5 refills | Status: AC

## 2022-03-10 MED ORDER — BLOOD GLUCOSE MONITORING SUPPL DEVI: 1.0000 | Freq: Three times a day (TID) | 0 refills | Status: AC

## 2022-03-10 MED ORDER — LANCETS MISC. MISC: 1.0000 | Freq: Three times a day (TID) | 5 refills | Status: AC

## 2022-03-10 NOTE — Patient Instructions (Addendum)
I still would like for you to monitor blood pressure at home. Pressure down to 128/88 on repeat but would prefer bottom # at least under 85 on home readings  Exercise prescription: 150 minutes a week of exercise  Please stop by lab before you go If you have mychart- we will send your results within 3 business days of [place] receiving them.  If you do not have mychart- we will call you about results within 5 business days of [place] receiving them.  *please also note that you will see labs on mychart as soon as they post. I will later go in and write notes on them- will say "notes from Dr. Yong Channel"   Recommended follow up: Return in about 1 year (around 03/11/2023) for physical or sooner if needed.Schedule b4 you leave. As long as home blood pressures look good

## 2022-03-10 NOTE — Progress Notes (Signed)
Phone: [phone]    Subjective:  Patient presents today for their annual physical. Chief complaint-noted.   See problem oriented charting- ROS- full  review of systems was completed and negative  except for: occasional muscle aches  The following were reviewed and entered/updated in epic: Past Medical History:  Diagnosis Date   Infertility male    Patient Active Problem List   Diagnosis Date Noted   Infertility male    Past Surgical History:  Procedure Laterality Date   none      Family History  Problem Relation Age of Onset   Diabetes Father    Healthy Mother     Medications- reviewed and updated No current outpatient medications on file.   No current facility-administered medications for this visit.    Allergies-reviewed and updated No Known Allergies  Social History   Social History Narrative   Family: Engaged since 2016.     Andrew Brooks has 33 son 38 years old in may 2024. 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 Oropharynx normal Neck: no thyromegaly CV: RRR no murmurs rubs or gallops Lungs: CTAB no crackles, wheeze, rhonchi Abdomen: soft/nontender/nondistended/normal bowel sounds. No rebound or guarding.  Ext: no edema Skin: warm, dry Neuro: grossly normal, moves all extremities, PERRLA     Assessment and Plan:  42 y.o. male presenting for annual physical.  Health Maintenance counseling: 1. Anticipatory guidance: Patient counseled regarding regular dental exams -q6 months, eye exams - about a year ago- had some vision issues but they improve-d hasn't needed  glasses,  avoiding smoking and second hand smoke, limiting alcohol to 2 beverages per day- mainly only weekends and under 2 beer or wine, no illicit drugs.   2. Risk factor reduction:  Advised patient of need for regular exercise and diet rich and fruits and vegetables to reduce risk of heart attack and stroke.  Exercise- 1-2 days a week exercise- a lot of trail walking- looking to get gym membership.  Diet/weight management-37 lbs weight loss! Cut out unhealthy foods, drinking more water, cut down on late night snacking. Ended up doing unintentional OMAD or 2 meals like a samll breakfast Wt Readings from Last 3 Encounters:  03/10/22 220 lb 12.8 oz (100.2 kg)  06/12/20 257 lb (116.6 kg)  08/10/17 241 lb 12.8 oz (109.7 kg)  3. Immunizations/screenings/ancillary studies- declines covid and flu shot  Immunization History  Administered Date(s) Administered   PFIZER(Purple Top)SARS-COV-2 Vaccination 08/12/2019, 09/05/2019   Td 09/17/2009   Tdap 06/12/2020  4. Prostate cancer screening- no family history, start at age 44   5. Colon cancer screening - no family history, start at age 24  6. Skin cancer screening/prevention- lower risk due to melanin content. advised regular sunscreen use. Denies worrisome, changing, or new skin lesions.  7. Testicular cancer screening- advised monthly self exams  8. STD screening- patient opts/ monogamoous 9.never smoker  Status of chronic or acute concerns   #elevated blood pressure reading S: medication: none Home readings #  s: was 130 when went home from last visit.  -does enjoy salt BP Readings from Last 3 Encounters:  03/10/22 128/88  06/12/20 (!) 158/92  08/10/17 128/88  A/P: likely white coat element but is still high normal- I still would like for you to monitor blood pressure at home. Pressure down to 128/88 on repeat but would prefer bottom # at least under 85 on home readings - discussed diet/exercise to bring this down  #hyperlipidemia S:  Medication:none  Lab Results  Component Value Date   CHOL 262 (H) 06/12/2020   HDL 47.10 06/12/2020   LDLCALC 189 (H) 06/12/2020   TRIG 128.0 06/12/2020   CHOLHDL 6 06/12/2020   A/P: Lipids very elevated at last visit-hoping for improvement with significant weight loss- update today   # Hyperglycemia/insulin resistance/prediabetes-fasting CBG elevated at 125-had recommended A1c but we were unable to reach him to come back S:  Medication: None A/P: check a1c with labs today with prior high sugar. Has had weight loss but seems intentional- hoping not related to diabetes- denies excess urination   Recommended follow up: Return in about 1 year (around 03/11/2023) for physical or sooner if needed.Schedule b4 you leave.  Lab/Order associations: fasting- but did enjoy super bowl food last night- alcohol and pizza   ICD-10-CM   1. Preventative health care  Z00.00 CBC with Differential/Platelet    Comprehensive metabolic panel    Lipid panel    Hemoglobin A1c    Urinalysis, Routine w reflex microscopic    2. Hyperlipidemia, unspecified hyperlipidemia type  E78.5 CBC with Differential/Platelet    Comprehensive metabolic panel    Lipid panel    Urinalysis, Routine w reflex microscopic    3. Hyperglycemia  R73.9 Hemoglobin A1c     No orders of the defined types were placed in this encounter.  Return precautions advised.   Garret Reddish, MD

## 2022-03-19 ENCOUNTER — Ambulatory Visit: Payer: No Typology Code available for payment source

## 2022-03-19 DIAGNOSIS — E1165 Type 2 diabetes mellitus with hyperglycemia: Secondary | ICD-10-CM

## 2022-03-19 NOTE — Addendum Note (Signed)
Addended by: Bing Neighbors on: 03/19/2022 09:29 AM   Modules accepted: Orders

## 2022-03-19 NOTE — Progress Notes (Signed)
Pt was seen today for nurse visit on how to inject and set up insulin, pt wanted to know if he could be referred out to endocrinology.

## 2022-03-23 ENCOUNTER — Encounter: Payer: Self-pay | Admitting: Family Medicine

## 2022-04-16 ENCOUNTER — Encounter: Payer: Self-pay | Admitting: Dietician

## 2022-04-16 ENCOUNTER — Encounter: Payer: No Typology Code available for payment source | Attending: Family Medicine | Admitting: Dietician

## 2022-04-16 VITALS — Ht 73.0 in | Wt 225.8 lb

## 2022-04-16 DIAGNOSIS — Z6829 Body mass index (BMI) 29.0-29.9, adult: Secondary | ICD-10-CM | POA: Insufficient documentation

## 2022-04-16 DIAGNOSIS — Z713 Dietary counseling and surveillance: Secondary | ICD-10-CM | POA: Diagnosis not present

## 2022-04-16 DIAGNOSIS — Z794 Long term (current) use of insulin: Secondary | ICD-10-CM

## 2022-04-16 DIAGNOSIS — E1165 Type 2 diabetes mellitus with hyperglycemia: Secondary | ICD-10-CM | POA: Insufficient documentation

## 2022-04-16 NOTE — Patient Instructions (Addendum)
Aim for 150 minutes of physical activity weekly. Goal: Get a gym membership. Exercise 2 days per week for 60 minutes.  Goal: aim to drink at least 64 oz water daily.   At meals, aim to make 1/4 plate protein, 1/4 plate complex carb, 1/2 plate non-starchy vegetables.

## 2022-04-16 NOTE — Progress Notes (Signed)
Diabetes Self-Management Education  Visit Type: First/Initial  Appt. Start Time: 0805 Appt. End Time: [id number]  04/16/2022  [name], identified by name and date of birth, is a 42 y.o. male with a diagnosis of Diabetes: Type 2.   ASSESSMENT  Primary concern: Pt is concerned about how he should be eating for controlling his diabetes.   History includes: type 2 diabetes Labs noted: 03/10/22 A1c 15.4% Medications include: insulin degludec 12 units 1x/day Supplements: none  Pt states at the time of his previous A1c he realized he was drinking 3-5 sugar sweetened beverages daily, along with sugary alcoholic drinks on the weekends.   Pt states before his diagnosis he was urinating more frequently and was not aware until now.   Pt states he checks his blood sugar once daily while fasting, and it has been between 80-130mg /dL.   Pt states he used to work out a lot but he stopped a couple of years ago. 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 Pt states he wants to get a gym membership and start working out again.   Pt reports he has made a lot of changes to his diet since his diagnosis, including less sweetened beverages, less juice, more water, and more balanced meals.   Height 6\' 1"  (1.854 m), weight 225 lb 12.8 oz (102.4 kg). Body mass index is 29.79 kg/m.   Diabetes Self-Management Education - 04/16/22 0806       Visit Information   Visit Type First/Initial      Initial Visit   Diabetes Type Type 2    Date Diagnosed 02/2022    Are you currently following a meal plan? No    Are you taking your medications as prescribed? Yes      Health Coping   How would you rate your overall health? Good      Psychosocial Assessment   Patient Belief/Attitude about Diabetes Motivated to manage diabetes    What is the hardest part about your diabetes right now, causing you the most concern, or is the most worrisome to you about your diabetes?   Taking/obtaining  medications    Self-care barriers None    Self-management support Doctor's office    Other persons present Patient;Spouse/SO    Patient Concerns Nutrition/Meal planning    Special Needs None    Preferred Learning Style No preference indicated    Learning Readiness Ready    How often do you need to have someone help you when you read instructions, pamphlets, or other written materials from your doctor or pharmacy? 1 - Never    What is the last grade level you completed in school? college      Pre-Education Assessment   Patient understands the diabetes disease and treatment process. Needs Instruction    Patient understands incorporating nutritional management into lifestyle. Needs Instruction    Patient undertands incorporating physical activity into lifestyle. Needs Instruction    Patient understands using medications safely. Needs Instruction    Patient understands monitoring blood glucose, interpreting and using results Needs Instruction    Patient understands prevention, detection, and treatment of acute complications. Needs Instruction    Patient understands prevention, detection, and treatment of chronic complications. Needs Instruction    Patient understands how to develop strategies to address psychosocial issues. Needs Instruction    Patient understands how to develop strategies to promote health/change behavior. Needs Instruction      Complications   Last HgB A1C per patient/outside source 15.4 %  How often do you check your blood sugar? 1-2 times/day    Fasting Blood glucose range (mg/dL) 70-129    Have you had a dilated eye exam in the past 12 months? Yes    Have you had a dental exam in the past 12 months? Yes    Are you checking your feet? Yes    How many days per week are you checking your feet? 7      Dietary Intake   Breakfast 8am: [place] granola bar OR eggs with bacon and toast OR oatmeal OR protein shake with protein powder and fruit    Snack (morning) none     Lunch 1pm: leftovers: chicken and rice OR chicken alfredo OR chicken caesar salad OR chickfila    Snack (afternoon) none    Dinner chicken and rice OR quesadillas OR chicken alfredo    Snack (evening) mixed nuts OR smoothie    Beverage(s) water (32-48 oz), diet soda, sweet tea and diet lemonade, 2-3 light beers a week      Activity / Exercise   Activity / Exercise Type ADL's    How many days per week do you exercise? 0    How many minutes per day do you exercise? 0    Total minutes per week of exercise 0      Patient Education   Previous Diabetes Education No    Disease Pathophysiology Definition of diabetes, type 1 and 2, and the diagnosis of diabetes;Factors that contribute to the development of diabetes;Explored patient's options for treatment of their diabetes    Healthy Eating Food label reading, portion sizes and measuring food.;Role of diet in the treatment of diabetes and the relationship between the three main macronutrients and blood glucose level;Plate Method;Reviewed blood glucose goals for pre and post meals and how to evaluate the patients' food intake on their blood glucose level.;Meal timing in regards to the patients' current diabetes medication.;Effects of alcohol on blood glucose and safety factors with consumption of alcohol.;Information on hints to eating out and maintain blood glucose control.;Meal options for control of blood glucose level and chronic complications.    Being Active Role of exercise on diabetes management, blood pressure control and cardiac health.;Helped patient identify appropriate exercises in relation to his/her diabetes, diabetes complications and other health issue.    Medications Taught/reviewed insulin/injectables, injection, site rotation, insulin/injectables storage and needle disposal.;Reviewed medication adjustment guidelines for hyperglycemia and sick days.    Monitoring Identified appropriate SMBG and/or A1C goals.;Daily foot exams;Yearly  dilated eye exam    Acute complications Discussed and identified patients' prevention, symptoms, and treatment of hyperglycemia.;Taught prevention, symptoms, and  treatment of hypoglycemia - the 15 rule.    Chronic complications Relationship between chronic complications and blood glucose control;Assessed and discussed foot care and prevention of foot problems;Dental care;Identified and discussed with patient  current chronic complications    Diabetes Stress and Support Worked with patient to identify barriers to care and solutions;Identified and addressed patients feelings and concerns about diabetes;Role of stress on diabetes    Lifestyle and Health Coping Lifestyle issues that need to be addressed for better diabetes care      Individualized Goals (developed by patient)   Nutrition General guidelines for healthy choices and portions discussed    Physical Activity Exercise 1-2 times per week;60 minutes per day    Medications take my medication as prescribed    Monitoring  Test my blood glucose as discussed    Problem Solving Eating Pattern  Reducing Risk examine blood glucose patterns;do foot checks daily;treat hypoglycemia with 15 grams of carbs if blood glucose less than 70mg /dL    Health Coping Ask for help with psychological, social, or emotional issues      Post-Education Assessment   Patient understands the diabetes disease and treatment process. Comprehends key points    Patient understands incorporating nutritional management into lifestyle. Comprehends key points    Patient undertands incorporating physical activity into lifestyle. Comprehends key points    Patient understands using medications safely. Demonstrates understanding / competency    Patient understands monitoring blood glucose, interpreting and using results Demonstrates understanding / competency    Patient understands prevention, detection, and treatment of acute complications. Comprehends key points    Patient  understands prevention, detection, and treatment of chronic complications. Comprehends key points    Patient understands how to develop strategies to address psychosocial issues. Comprehends key points    Patient understands how to develop strategies to promote health/change behavior. Comprehends key points      Outcomes   Expected Outcomes Demonstrated interest in learning. Expect positive outcomes    Future DMSE 3-4 months    Program Status Not Completed             Individualized Plan for Diabetes Self-Management Training:   Learning Objective:  Patient will have a greater understanding of diabetes self-management. Patient education plan is to attend individual and/or group sessions per assessed needs and concerns.   Plan:   Patient Instructions  Aim for 150 minutes of physical activity weekly. Goal: Get a gym membership. Exercise 2 days per week for 60 minutes.  Goal: aim to drink at least 64 oz water daily.   At meals, aim to make 1/4 plate protein, 1/4 plate complex carb, 1/2 plate non-starchy vegetables.     Expected Outcomes:  Demonstrated interest in learning. Expect positive outcomes  Education material provided: ADA - How to Thrive: A Guide for Your Journey with Diabetes  If problems or questions, patient to contact team via:  Phone  Future DSME appointment: 3-4 months

## 2022-07-11 ENCOUNTER — Ambulatory Visit: Payer: No Typology Code available for payment source | Admitting: Family Medicine

## 2022-07-11 ENCOUNTER — Encounter: Payer: Self-pay | Admitting: Family Medicine

## 2022-07-11 VITALS — BP 146/96 | HR 87 | Temp 98.0°F | Ht 73.0 in | Wt 235.6 lb

## 2022-07-11 DIAGNOSIS — E785 Hyperlipidemia, unspecified: Secondary | ICD-10-CM | POA: Diagnosis not present

## 2022-07-11 DIAGNOSIS — Z7984 Long term (current) use of oral hypoglycemic drugs: Secondary | ICD-10-CM

## 2022-07-11 DIAGNOSIS — E1165 Type 2 diabetes mellitus with hyperglycemia: Secondary | ICD-10-CM | POA: Diagnosis not present

## 2022-07-11 DIAGNOSIS — E1169 Type 2 diabetes mellitus with other specified complication: Secondary | ICD-10-CM | POA: Diagnosis not present

## 2022-07-11 DIAGNOSIS — E119 Type 2 diabetes mellitus without complications: Secondary | ICD-10-CM | POA: Insufficient documentation

## 2022-07-11 LAB — POCT GLYCOSYLATED HEMOGLOBIN (HGB A1C): Hemoglobin A1C: 7.1 % — AB (ref 4.0–5.6)

## 2022-07-11 MED ORDER — TRESIBA FLEXTOUCH 100 UNIT/ML ~~LOC~~ SOPN: 10.0000 [IU] | PEN_INJECTOR | Freq: Every day | SUBCUTANEOUS | 2 refills | Status: DC

## 2022-07-11 MED ORDER — METFORMIN HCL ER 500 MG PO TB24: 500.0000 mg | ORAL_TABLET | Freq: Every day | ORAL | 5 refills | Status: DC

## 2022-07-11 NOTE — Progress Notes (Signed)
Phone [phone] In person visit   Subjective:   Andrew Brooks is a 42 y.o. year old very pleasant male patient who presents for/with See problem oriented charting Chief Complaint  Patient presents with   Medical Management of Chronic Issues   Past Medical History-  Patient Active Problem List   Diagnosis Date Noted   Controlled type 2 diabetes mellitus without complication, without long-term current use of insulin (HCC) 07/11/2022    Priority: High   Hyperlipidemia associated with type 2 diabetes mellitus (HCC) 07/11/2022    Priority: Medium    Infertility male    Medications- reviewed and updated Current Outpatient Medications  Medication Sig Dispense Refill   Blood Glucose Monitoring Suppl DEVI 1 each by Does not apply route in the morning, at noon, and at bedtime. May substitute to any manufacturer covered by patient's insurance. 1 each 0   Insulin Pen Needle (NOVOFINE PLUS PEN NEEDLE) 32G X 4 MM MISC 1 each by Does not apply route daily. 30 each 5   metFORMIN (GLUCOPHAGE-XR) 500 MG 24 hr tablet Take 1 tablet (500 mg total) by mouth daily with breakfast. 30 tablet 5   insulin degludec (TRESIBA FLEXTOUCH) 100 UNIT/ML FlexTouch Pen Inject 10-30 Units into the skin daily. 6 mL 2   No current facility-administered medications for this visit.     Objective:  BP (!) 146/96   Pulse 87   Temp 98 F (36.7 C)   Ht 6\' 1"  (1.854 m)   Wt 235 lb 9.6 oz (106.9 kg)   SpO2 97%   BMI 31.08 kg/m  Gen: NAD, resting comfortably CV: RRR no murmurs rubs or gallops Lungs: CTAB no crackles, wheeze, rhonchi Ext: no edema Skin: warm, dry    Assessment and Plan   # Diabetes S: Medication:Tresiba currently 12 units  CBGs- average 115-120 occasional more . No low blood sugars Exercise and diet- has cut out sugary drinks for most part- occasional half tea half lemonade but had been much higher. Cut out cider beers- picks lower carb beers. Reading more labels to look sugar.  - exercise  2 days a week wants to get up to 3  -weight up some but was likely very dehydrated at last visit with high sugar Lab Results  Component Value Date   HGBA1C 7.1 (A) 07/11/2022   HGBA1C 15.4 Repeated and verified X2. (H) 03/10/2022   A/P: drastic improvement in a1c- patient has done incredible job with changing lifestyle and taking Andrew Brooks consistently -stay with Andrew Brooks 12 units -add metformin 500 mg XR daily - update me in a month with how home blood sugars are or sooner if any sugar under 500 - follow up around September 15th to recheck  #elevated blood pressure reading S: medication: none Home readings #s: thinks he has cuff BP Readings from Last 3 Encounters:  07/11/22 (!) 146/96  03/10/22 128/88  06/12/20 (!) 158/92  A/P: I am concerned he may also have high blood pressure but he reports fair amount of anxiety with visit today so- Your blood pressure is higher than I would like in office. I would like for you to buy/use a home cuff to check at least 4x a week. Your goal is <135/85.  Update me in about 1 month when you reach out about blood sugar - at next visit if you would liek for [place] to compare cuffs can bring cuff with you  #hyperlipidemia- LDL 189 2022 and 289 in 2024 S: Medication:none  Lab Results  Component Value  Date   CHOL 353 (H) 03/10/2022   HDL 39.80 03/10/2022   LDLCALC 289 (H) 03/10/2022   TRIG 122.0 03/10/2022   CHOLHDL 9 03/10/2022   A/P: lipids have been high but we wonder if falsely elevated- was far higher than any prior reading last time- we are going to check direct LDL - he wants to do this next visit along with CMP  Recommended follow up: Return in about 3 months (around 10/12/2022) for followup or sooner if needed.Schedule b4 you leave.  Lab/Order associations:   ICD-10-CM   1. Controlled type 2 diabetes mellitus without complication, without long-term current use of insulin (HCC)  E11.9 Urine Microalbumin w/creat. ratio    POCT glycosylated hemoglobin  (Hb A1C)    2. Hyperlipidemia associated with type 2 diabetes mellitus (HCC)  E11.69    E78.5     3. Poorly controlled diabetes mellitus (HCC)  E11.65 insulin degludec (TRESIBA FLEXTOUCH) 100 UNIT/ML FlexTouch Pen      Meds ordered this encounter  Medications   metFORMIN (GLUCOPHAGE-XR) 500 MG 24 hr tablet    Sig: Take 1 tablet (500 mg total) by mouth daily with breakfast.    Dispense:  30 tablet    Refill:  5   insulin degludec (TRESIBA FLEXTOUCH) 100 UNIT/ML FlexTouch Pen    Sig: Inject 10-30 Units into the skin daily.    Dispense:  6 mL    Refill:  2    Return precautions advised.  Tana Conch, MD

## 2022-07-11 NOTE — Patient Instructions (Addendum)
Get diabetic eye exam scheduled.  drastic improvement in a1c- patient has done incredible job with changing lifestyle and taking [name] consistently -stay with [name] 12 units -add metformin 500 mg XR daily - update me in a month with how home blood sugars are or sooner if any sugar under 500 - follow up around September 15th to recheck  Your blood pressure is higher than I would like in office. I would like for you to buy/use a home cuff to check at least 4x a week. Your goal is <135/85.  Update me in about 1 month when you reach out about blood sugar - at next visit if you would liek for [place] to compare cuffs can bring cuff with you  Recommended follow up: Return in about 3 months (around 10/12/2022) for followup or sooner if needed.Schedule b4 you leave.

## 2022-07-17 ENCOUNTER — Ambulatory Visit: Payer: No Typology Code available for payment source | Admitting: Dietician

## 2022-08-27 ENCOUNTER — Ambulatory Visit: Payer: No Typology Code available for payment source | Admitting: Family Medicine

## 2022-09-11 ENCOUNTER — Encounter (INDEPENDENT_AMBULATORY_CARE_PROVIDER_SITE_OTHER): Payer: Self-pay

## 2022-10-15 ENCOUNTER — Encounter: Payer: Self-pay | Admitting: Family Medicine

## 2022-10-16 ENCOUNTER — Other Ambulatory Visit: Payer: Self-pay

## 2022-10-16 DIAGNOSIS — E1165 Type 2 diabetes mellitus with hyperglycemia: Secondary | ICD-10-CM

## 2022-10-16 MED ORDER — NOVOFINE PLUS PEN NEEDLE 32G X 4 MM MISC: 1.0000 | Freq: Every day | 5 refills | Status: DC

## 2022-10-16 MED ORDER — TRESIBA FLEXTOUCH 100 UNIT/ML ~~LOC~~ SOPN: 10.0000 [IU] | PEN_INJECTOR | Freq: Every day | SUBCUTANEOUS | 2 refills | Status: DC

## 2022-10-22 ENCOUNTER — Ambulatory Visit: Payer: No Typology Code available for payment source | Admitting: Family Medicine

## 2022-11-18 ENCOUNTER — Other Ambulatory Visit (HOSPITAL_COMMUNITY): Payer: Self-pay

## 2023-03-25 ENCOUNTER — Ambulatory Visit (INDEPENDENT_AMBULATORY_CARE_PROVIDER_SITE_OTHER): Payer: No Typology Code available for payment source | Admitting: Family Medicine

## 2023-03-25 ENCOUNTER — Encounter: Payer: Self-pay | Admitting: Family Medicine

## 2023-03-25 ENCOUNTER — Other Ambulatory Visit: Payer: Self-pay

## 2023-03-25 VITALS — BP 144/90 | HR 95 | Temp 97.2°F | Ht 73.0 in | Wt 242.8 lb

## 2023-03-25 DIAGNOSIS — Z0000 Encounter for general adult medical examination without abnormal findings: Secondary | ICD-10-CM

## 2023-03-25 DIAGNOSIS — E1169 Type 2 diabetes mellitus with other specified complication: Secondary | ICD-10-CM | POA: Diagnosis not present

## 2023-03-25 DIAGNOSIS — Z7985 Long-term (current) use of injectable non-insulin antidiabetic drugs: Secondary | ICD-10-CM | POA: Diagnosis not present

## 2023-03-25 DIAGNOSIS — E785 Hyperlipidemia, unspecified: Secondary | ICD-10-CM

## 2023-03-25 DIAGNOSIS — Z7984 Long term (current) use of oral hypoglycemic drugs: Secondary | ICD-10-CM

## 2023-03-25 DIAGNOSIS — E119 Type 2 diabetes mellitus without complications: Secondary | ICD-10-CM

## 2023-03-25 DIAGNOSIS — E1165 Type 2 diabetes mellitus with hyperglycemia: Secondary | ICD-10-CM | POA: Diagnosis not present

## 2023-03-25 LAB — CBC WITH DIFFERENTIAL/PLATELET
Basophils Absolute: 0 10*3/uL (ref 0.0–0.1)
Basophils Relative: 0.4 % (ref 0.0–3.0)
Eosinophils Absolute: 0.1 10*3/uL (ref 0.0–0.7)
Eosinophils Relative: 1.7 % (ref 0.0–5.0)
HCT: 41.2 % (ref 39.0–52.0)
Hemoglobin: 13.3 g/dL (ref 13.0–17.0)
Lymphocytes Relative: 45.9 % (ref 12.0–46.0)
Lymphs Abs: 2.1 10*3/uL (ref 0.7–4.0)
MCHC: 32.2 g/dL (ref 30.0–36.0)
MCV: 86.1 fL (ref 78.0–100.0)
Monocytes Absolute: 0.2 10*3/uL (ref 0.1–1.0)
Monocytes Relative: 5.4 % (ref 3.0–12.0)
Neutro Abs: 2.1 10*3/uL (ref 1.4–7.7)
Neutrophils Relative %: 46.6 % (ref 43.0–77.0)
Platelets: 313 10*3/uL (ref 150.0–400.0)
RBC: 4.78 Mil/uL (ref 4.22–5.81)
RDW: 14.3 % (ref 11.5–15.5)
WBC: 4.5 10*3/uL (ref 4.0–10.5)

## 2023-03-25 LAB — LIPID PANEL
Cholesterol: 240 mg/dL — ABNORMAL HIGH (ref 0–200)
HDL: 44.3 mg/dL (ref >39.00)
LDL Cholesterol: 177 mg/dL — ABNORMAL HIGH (ref 0–99)
NonHDL: 195.93
Total CHOL/HDL Ratio: 5
Triglycerides: 93 mg/dL (ref 0.0–149.0)
VLDL: 18.6 mg/dL (ref 0.0–40.0)

## 2023-03-25 LAB — COMPREHENSIVE METABOLIC PANEL
ALT: 22 U/L (ref 0–53)
AST: 23 U/L (ref 0–37)
Albumin: 4.6 g/dL (ref 3.5–5.2)
Alkaline Phosphatase: 55 U/L (ref 39–117)
BUN: 16 mg/dL (ref 6–23)
CO2: 28 meq/L (ref 19–32)
Calcium: 9.1 mg/dL (ref 8.4–10.5)
Chloride: 101 meq/L (ref 96–112)
Creatinine, Ser: 1.31 mg/dL (ref 0.40–1.50)
GFR: 66.57 mL/min (ref >60.00)
Glucose, Bld: 169 mg/dL — ABNORMAL HIGH (ref 70–99)
Potassium: 3.7 meq/L (ref 3.5–5.1)
Sodium: 138 meq/L (ref 135–145)
Total Bilirubin: 0.9 mg/dL (ref 0.2–1.2)
Total Protein: 7.6 g/dL (ref 6.0–8.3)

## 2023-03-25 LAB — HEMOGLOBIN A1C: Hgb A1c MFr Bld: 8.7 % — ABNORMAL HIGH (ref 4.6–6.5)

## 2023-03-25 LAB — MICROALBUMIN / CREATININE URINE RATIO
Creatinine,U: 179.3 mg/dL
Microalb Creat Ratio: 7.9 mg/g (ref 0.0–30.0)
Microalb, Ur: 1.4 mg/dL (ref 0.0–1.9)

## 2023-03-25 MED ORDER — METFORMIN HCL ER 500 MG PO TB24: 500.0000 mg | ORAL_TABLET | Freq: Two times a day (BID) | ORAL | 3 refills | Status: AC

## 2023-03-25 MED ORDER — METFORMIN HCL ER 500 MG PO TB24: 500.0000 mg | ORAL_TABLET | Freq: Every day | ORAL | 3 refills | Status: DC

## 2023-03-25 MED ORDER — TRESIBA FLEXTOUCH 100 UNIT/ML ~~LOC~~ SOPN: 10.0000 [IU] | PEN_INJECTOR | Freq: Every day | SUBCUTANEOUS | 3 refills | Status: DC

## 2023-03-25 NOTE — Addendum Note (Signed)
 Addended by: Shelva Majestic on: 03/25/2023 08:37 AM   Modules accepted: Orders

## 2023-03-25 NOTE — Progress Notes (Signed)
 Phone: [phone]    Subjective:  Patient presents today for their annual physical. Chief complaint-noted.   See problem oriented charting- ROS- full  review of systems was completed and negative  Per full ROS sheet completed by patient  The following were reviewed and entered/updated in epic: Past Medical History:  Diagnosis Date   Infertility male    Patient Active Problem List   Diagnosis Date Noted   Controlled type 2 diabetes mellitus without complication, without long-term current use of insulin (HCC) 07/11/2022    Priority: High   Hyperlipidemia associated with type 2 diabetes mellitus (HCC) 07/11/2022    Priority: Medium    Infertility male    Past Surgical History:  Procedure Laterality Date   none      Family History  Problem Relation Age of Onset   Diabetes Father    Healthy Mother     Medications- reviewed and updated Current Outpatient Medications  Medication Sig Dispense Refill   Blood Glucose Monitoring Suppl DEVI 1 each by Does not apply route in the morning, at noon, and at bedtime. May substitute to any manufacturer covered by patient's insurance. 1 each 0   insulin degludec (TRESIBA FLEXTOUCH) 100 UNIT/ML FlexTouch Pen Inject 10-30 Units into the skin daily. Dx:E11.9 6 mL 2   Insulin Pen Needle (NOVOFINE PLUS PEN NEEDLE) 32G X 4 MM MISC 1 each by Does not apply route daily. Dx: E11.9 30 each 5   metFORMIN (GLUCOPHAGE-XR) 500 MG 24 hr tablet Take 1 tablet (500 mg total) by mouth daily with breakfast. 30 tablet 5   No current facility-administered medications for this visit.    Allergies-reviewed and updated No Known Allergies  Social History   Social History Narrative   Family: Engaged since 2016.     Steffanie Rainwater has 77 son 73 years old in may 2024. 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 Oropharynx normal Neck: no thyromegaly CV: RRR no murmurs rubs or gallops Lungs: CTAB no crackles, wheeze, rhonchi Abdomen: soft/nontender/nondistended/normal bowel sounds. No rebound or guarding.  Ext: no edema Skin: warm, dry Neuro: grossly normal, moves all extremities, PERRLA Declines rectal and genitourinary exam    Diabetic Foot Exam - Simple   Simple Foot Form Diabetic Foot exam was performed with the following findings: Yes 03/25/2023  8:18 AM  Visual Inspection No deformities, no ulcerations, no other skin breakdown bilaterally: Yes Sensation Testing Intact to touch and monofilament testing bilaterally: Yes Pulse Check Posterior Tibialis and Dorsalis pulse intact bilaterally: Yes Comments        Assessment and Plan:  43 y.o. male presenting for annual physical.  Health Maintenance counseling: 1. Anticipatory guidance: Patient counseled regarding regular dental exams -q6 months, eye exams - planning to schedule,  avoiding smoking and second hand smoke- , limiting alcohol to 2 beverages per day- maybe 4 over a weekend- whiskey or tequilla , no illicit drugs.   2. Risk factor reduction:  Advised patient of need for  regular exercise and diet rich and fruits and vegetables to reduce risk of heart attack and stroke.  Exercise- doing some running and biking- some light weight training 3x a week.  Diet/weight management-weight up 22 lbs from last year but was dehydrated from diabetes at that time . Doing protein shake in am (cut out cereal and bagels), lighter lunch, good dinner . Has cut down on sweets Wt Readings from Last 3 Encounters:  03/25/23 242 lb 12.8 oz (110.1 kg)  07/11/22 235 lb 9.6 oz (106.9 kg)  04/16/22 225 lb 12.8 oz (102.4 kg)   3. Immunizations/screenings/ancillary studies- opts out flu, COVID, Prevnar 20 for now  Immunization History  Administered Date(s) Administered   PFIZER(Purple Top)SARS-COV-2 Vaccination 08/12/2019, 09/05/2019   Td 09/17/2009   Tdap 06/12/2020  4. Prostate cancer screening- no family history, start at age 22  5. Colon cancer screening - no family history, start at age 16  6. Skin cancer screening/prevention- lower risk due to melanin content. advised regular sunscreen use. Denies worrisome, changing, or new skin lesions.  7. Testicular cancer screening- advised monthly self exams  8. STD screening- patient opts out- with long term fiancee 9. Smoking associated screening- never smoker  Status of chronic or acute concerns   # Diabetes S: Medication: Tresiba 12 units, metformin 500 units daily XR  CBGs- 120s for most part. No lows  A/P: hopefully stable- update a1c today. Continue current meds for now   #hyperlipidemia S: Medication: none  Lab Results  Component Value Date   CHOL 353 (H) 03/10/2022   HDL 39.80 03/10/2022   LDLCALC 289 (H) 03/10/2022   TRIG 122.0 03/10/2022   CHOLHDL 9 03/10/2022   A/P: has tried to improve diet- discussed with how high his is even if below 180 likely would recommend statin weekly at least- if still over 180 would recommend daily option  #elevated blood pressure  S: medication: none Home readings #s: around 140's at home but is not resting beforehand A/P: blood pressure elevated today slightly- prefer <140/90 at absolute max- he is going to do some home monitoring after 5 minutes of rest, feet flat on ground and with arm at heart level and update me in a week on mychart -keep up great job with exercise and lets work on weight loss- myfitnesspal could help with caloric reduction- also want you to watch salt content- dash eating plan helpful  Recommended follow up: Return in about 4 months (around 07/23/2023) for followup or sooner if needed.Schedule  b4 you leave.  Lab/Order associations: fasting   ICD-10-CM   1. Preventative health care  Z00.00     2. Controlled type 2 diabetes mellitus without complication, without long-term current use of insulin (HCC)  E11.9     3. Hyperlipidemia associated with type 2 diabetes mellitus (HCC)  E11.69    E78.5       No orders of the defined types were placed in this encounter.   Return precautions advised.   Tana Conch, MD

## 2023-03-25 NOTE — Patient Instructions (Addendum)
 Get diabetic eye exam schedule.  blood pressure elevated today slightly- prefer <140/90 at absolute max- he is going to do some home monitoring after 5 minutes of rest, feet flat on ground and with arm at heart level and update me in a week on mychart -keep up great job with exercise and lets work on weight loss- myfitnesspal could help with caloric reduction- also want you to watch salt content- dash eating plan helpful  Please stop by lab before you go If you have mychart- we will send your results within 3 business days of [place] receiving them.  If you do not have mychart- we will call you about results within 5 business days of [place] receiving them.  *please also note that you will see labs on mychart as soon as they post. I will later go in and write notes on them- will say "notes from Dr. Durene Cal"   Recommended follow up: Return in about 4 months (around 07/23/2023) for followup or sooner if needed.Schedule b4 you leave.

## 2023-04-10 ENCOUNTER — Telehealth: Payer: Self-pay

## 2023-04-10 NOTE — Telephone Encounter (Signed)
 Patient was identified as falling into the [place] Measure - Diabetes.   Patient was: Patient completed A1C check w/ PCP 03/25/23.

## 2023-04-16 ENCOUNTER — Telehealth: Payer: Self-pay

## 2023-04-16 NOTE — Telephone Encounter (Signed)
 Patient was identified as falling into the [place] Measure - Diabetes.   Patient was: Left voicemail to schedule with primary care provider. Pt scheduled for next visit in July but advised needs A1C check in May.

## 2023-06-03 ENCOUNTER — Encounter: Payer: Self-pay | Admitting: Pharmacist

## 2023-06-03 NOTE — Progress Notes (Signed)
 Patient was identified as falling into the [place] Measure - Diabetes.   Patient was seen by Dr Arlene Ben 03/25/2023 - A1c was noted to have increased to 8.7%. Dr Arlene Ben increased metformin  ER 500mg  from once per day to twice daily. He also recommended patient increase Tresiba  to 13 units once a day. Per MyChart message patient was to check blood glucose and send an update to PCP after 3 weeks but I don't see any updated home blood glucose number.    Attempted to out reach patient by phone to discuss medications and DM, check to see if he has a glucometer and review recent results. Outreach was unsuccessful but I LM on VM with my CB# [phone]. Checked with his pharmacy benefits to see if Continuous Glucose Monitoring is covered - [name] was not on formulary but Dex Com G7 system was - per [employer] cost would be $30 per month.   Sent message to patient by MyChart  [name], PharmD Clinical Pharmacist [facility]  Population Health [phone]

## 2023-06-24 LAB — HM DIABETES EYE EXAM

## 2023-07-27 ENCOUNTER — Other Ambulatory Visit: Payer: Self-pay

## 2023-07-27 DIAGNOSIS — E1165 Type 2 diabetes mellitus with hyperglycemia: Secondary | ICD-10-CM

## 2023-07-27 MED ORDER — TRESIBA FLEXTOUCH 100 UNIT/ML ~~LOC~~ SOPN: 10.0000 [IU] | PEN_INJECTOR | Freq: Every day | SUBCUTANEOUS | 3 refills | Status: DC

## 2023-07-28 ENCOUNTER — Other Ambulatory Visit (HOSPITAL_COMMUNITY): Payer: Self-pay

## 2023-07-28 MED ORDER — LANTUS SOLOSTAR 100 UNIT/ML ~~LOC~~ SOPN: PEN_INJECTOR | SUBCUTANEOUS | 3 refills | Status: AC

## 2023-07-28 NOTE — Progress Notes (Signed)
 Lantus sent to the pharmacy.

## 2023-07-28 NOTE — Progress Notes (Signed)
 See below

## 2023-07-28 NOTE — Progress Notes (Signed)
 insulin degludec  (TRESIBA  FLEXTOUCH) 100 UNIT/ML FlexTouch Pen 6 mL 3 07/27/2023 --  Sig:   Inject 10-30 Units into the skin daily. Dx:E11.9    Route:   Subcutaneous      You may send with same directions for lantus

## 2023-08-05 ENCOUNTER — Ambulatory Visit: Payer: No Typology Code available for payment source | Admitting: Family Medicine

## 2023-09-23 ENCOUNTER — Telehealth: Payer: Self-pay

## 2023-09-23 ENCOUNTER — Encounter: Payer: Self-pay | Admitting: Family Medicine

## 2023-09-23 ENCOUNTER — Ambulatory Visit: Admitting: Family Medicine

## 2023-09-23 VITALS — BP 140/90 | HR 96 | Temp 97.2°F | Ht 73.0 in | Wt 242.0 lb

## 2023-09-23 DIAGNOSIS — E1165 Type 2 diabetes mellitus with hyperglycemia: Secondary | ICD-10-CM | POA: Diagnosis not present

## 2023-09-23 DIAGNOSIS — I10 Essential (primary) hypertension: Secondary | ICD-10-CM | POA: Diagnosis not present

## 2023-09-23 DIAGNOSIS — E119 Type 2 diabetes mellitus without complications: Secondary | ICD-10-CM

## 2023-09-23 DIAGNOSIS — E785 Hyperlipidemia, unspecified: Secondary | ICD-10-CM

## 2023-09-23 DIAGNOSIS — Z7984 Long term (current) use of oral hypoglycemic drugs: Secondary | ICD-10-CM

## 2023-09-23 LAB — POCT GLYCOSYLATED HEMOGLOBIN (HGB A1C): Hemoglobin A1C: 7.5 % — AB (ref 4.0–5.6)

## 2023-09-23 MED ORDER — HYDROCHLOROTHIAZIDE 25 MG PO TABS: 25.0000 mg | ORAL_TABLET | Freq: Every day | ORAL | 3 refills | Status: AC

## 2023-09-23 MED ORDER — BLOOD GLUCOSE TEST VI STRP: 1.0000 | ORAL_STRIP | Freq: Three times a day (TID) | 11 refills | Status: AC

## 2023-09-23 MED ORDER — NOVOFINE PLUS PEN NEEDLE 32G X 4 MM MISC: 1.0000 | Freq: Every day | 5 refills | Status: AC

## 2023-09-23 NOTE — Telephone Encounter (Signed)
 Patient was identified as falling into the [place] Measure - Diabetes.   Patient was: Appointment scheduled for lab or office visit for A1c.  A1C completed in office 09/23/23 results in chart under 8.0.

## 2023-09-23 NOTE — Progress Notes (Signed)
 Phone [phone] In person visit   Subjective:   Andrew Brooks is a 43 y.o. year old very pleasant male patient who presents for/with See problem oriented charting Chief Complaint  Patient presents with   Diabetes   Discussed the use of AI scribe software for clinical note transcription with the patient, who gave verbal consent to proceed.  History of Present Illness   Andrew Brooks is a 42 year old male with diabetes, hyperlipidemia, and hypertension who presents for management of these conditions.  He has seen improvement in his diabetes management, with a recent A1c of 7.5%, down from 8.7% in February. He is taking metformin  500 mg twice daily without gastrointestinal side effects. He faces challenges with dietary consistency and exercise, aiming to exercise three days a week but often managing only two due to work and life commitments. He is currently on 13 units of Tresiba  and reports morning blood sugars around 130 mg/dL.  Regarding hyperlipidemia, past LDL cholesterol levels have ranged from 140 to 289 mg/dL, with the most recent being 177 mg/dL six months ago. He has not started any new cholesterol medications recently and is considering lifestyle changes to manage his cholesterol levels.  For hypertension, home blood pressure readings are around 140/90 mmHg confirming diagnosis. He has not been consistently monitoring his blood pressure at home but recalls similar readings in the past.  He also mentions the challenge of balancing healthy eating with social activities.        Past Medical History-  Patient Active Problem List   Diagnosis Date Noted   Controlled type 2 diabetes mellitus without complication, without long-term current use of insulin (HCC) 07/11/2022    Priority: High   Essential hypertension 09/23/2023    Priority: Medium    Hyperlipidemia associated with type 2 diabetes mellitus (HCC) 07/11/2022    Priority: Medium    Infertility male     Priority:  Low    Medications- reviewed and updated Current Outpatient Medications  Medication Sig Dispense Refill   Blood Glucose Monitoring Suppl DEVI 1 each by Does not apply route in the morning, at noon, and at bedtime. May substitute to any manufacturer covered by patient's insurance. 1 each 0   Glucose Blood (BLOOD GLUCOSE TEST STRIPS) STRP 1 each by In Vitro route in the morning, at noon, and at bedtime. May substitute to any manufacturer covered by patient's insurance. 90 each 11   hydrochlorothiazide  (HYDRODIURIL ) 25 MG tablet Take 1 tablet (25 mg total) by mouth daily. 90 tablet 3   insulin glargine  (LANTUS  SOLOSTAR) 100 UNIT/ML Solostar Pen Inject 10-30 Units into the skin daily. Dx:E11.9 15 mL 3   metFORMIN  (GLUCOPHAGE -XR) 500 MG 24 hr tablet Take 1 tablet (500 mg total) by mouth 2 (two) times daily with a meal. 180 tablet 3   Insulin Pen Needle (NOVOFINE PLUS PEN NEEDLE) 32G X 4 MM MISC 1 each by Does not apply route daily. Dx: E11.9 30 each 5   No current facility-administered medications for this visit.     Objective:  BP (!) 140/90   Pulse 96   Temp (!) 97.2 F (36.2 C) (Temporal)   Ht 6' 1 (1.854 m)   Wt 242 lb (109.8 kg)   SpO2 97%   BMI 31.93 kg/m  Gen: NAD, resting comfortably CV: RRR no murmurs rubs or gallops Lungs: CTAB no crackles, wheeze, rhonchi Ext: no edema Skin: warm, dry     Assessment and Plan      #Type 2 diabetes  mellitus Improved glycemic control with hemoglobin A1c decreased from 8.7% in February to 7.5% currently. Tolerating metformin  well without gastrointestinal side effects. Challenges with dietary adherence and exercise consistency. Morning blood sugars around 130 mg/dL, slightly above target range of 80-120 mg/dL. - Continue metformin  500 mg twice daily. - Increase Tresiba  to 14 units daily. - Encourage exercise increase from 2 days a week up to 4 days per week. - Focus on dietary modifications to aid in weight loss. add dash eating plan as  well for bp below  - Monitor blood sugars and report any readings under 70 mg/dL as we will need to decrease the tresiba  - Schedule follow-up in 6 months for physical and A1c check. happy to see you sooner if needed- such as 4 months  #Hypertension Blood pressure consistently around 140/90 mmHg. Interest in starting medication to manage blood pressure. - Start hydrochlorothiazide  25 mg once daily. - Provide DASH diet handout. - Monitor blood pressure at home three times a week and report readings in two weeks after starting medicine - Recheck sodium levels at next visit as can lower sodium and potassium   #Hyperlipidemia Increased cardiovascular risk associated with diabetes. Prefers to focus on lifestyle changes and revisit medication decision at next physical. - Reassess need for rosuvastatin at next physical but we discussed even with improved #s national recommendations are for at least once weekly statin for plaque stabilization      Recommended follow up: Return in about 6 months (around 03/25/2024) for physical or sooner if needed.Schedule b4 you leave.   Lab/Order associations:   ICD-10-CM   1. Controlled type 2 diabetes mellitus without complication, without long-term current use of insulin (HCC)  E11.9 POCT HgB A1C    2. Poorly controlled diabetes mellitus (HCC)  E11.65 Insulin Pen Needle (NOVOFINE PLUS PEN NEEDLE) 32G X 4 MM MISC    3. Essential hypertension  I10       Meds ordered this encounter  Medications   hydrochlorothiazide  (HYDRODIURIL ) 25 MG tablet    Sig: Take 1 tablet (25 mg total) by mouth daily.    Dispense:  90 tablet    Refill:  3   Insulin Pen Needle (NOVOFINE PLUS PEN NEEDLE) 32G X 4 MM MISC    Sig: 1 each by Does not apply route daily. Dx: E11.9    Dispense:  30 each    Refill:  5   Glucose Blood (BLOOD GLUCOSE TEST STRIPS) STRP    Sig: 1 each by In Vitro route in the morning, at noon, and at bedtime. May substitute to any manufacturer covered by  patient's insurance.    Dispense:  90 each    Refill:  11    Return precautions advised.  Garnette Lukes, MD

## 2023-09-23 NOTE — Patient Instructions (Addendum)
  VISIT SUMMARY:  Today, we reviewed your diabetes, high blood pressure, and high cholesterol. Your diabetes management has improved, but we need to make some adjustments to your medications and lifestyle. We also started a new medication for your blood pressure and discussed lifestyle changes for your cholesterol.  YOUR PLAN:  TYPE 2 DIABETES MELLITUS: Your blood sugar control has improved, but your morning levels are still slightly above the target range. -goal a1c 7.0 and at 7.5 today but with poc reading could actually be 8 -Continue taking metformin  500 mg twice daily. -Increase Tresiba  to 14 units daily. -Aim to exercise 3-4 days per week. -Focus on dietary changes to help with weight loss. -Monitor your blood sugars and report any readings under 70 mg/dL. -Schedule a follow-up in 6 months for a physical and A1c check.  HYPERTENSION: Your blood pressure is consistently around 140/90 mmHg, and we are starting a new medication to help manage it. -Start taking hydrochlorothiazide  25 mg once daily. -Follow the DASH diet; a handout will be provided. -Monitor your blood pressure at home three times a week and report the readings in two weeks. -We will recheck your sodium levels at your next visit.  HYPERLIPIDEMIA: Your cholesterol levels are high, which increases your cardiovascular risk, especially with diabetes. -Focus on lifestyle changes for now, and we will reassess the need for rosuvastatin at your next physical.  Contains text generated by [employer].    Recommended follow up: Return in about 6 months (around 03/25/2024) for physical or sooner if needed.Schedule b4 you leave.

## 2023-10-22 ENCOUNTER — Telehealth: Payer: Self-pay | Admitting: Family Medicine

## 2023-10-22 NOTE — Telephone Encounter (Signed)
-----   Message from Garnette Lukes sent at 10/21/2023  2:14 PM EDT ----- Regarding: FW: bp Team can you check in about his blood pressure ----- Message ----- From: Lukes Garnette KIDD, MD Sent: 10/14/2023  12:00 AM EDT To: Garnette KIDD Lukes, MD Subject: bp                                             Did I get his blood pressure readings? Blood pressure

## 2023-10-22 NOTE — Telephone Encounter (Signed)
 Left a voicemail for patient checking in on him and requesting blood pressure readings. Left call back number or advised to send a MyChart message.

## 2023-11-04 ENCOUNTER — Telehealth: Admitting: Physician Assistant

## 2023-11-04 DIAGNOSIS — R058 Other specified cough: Secondary | ICD-10-CM | POA: Diagnosis not present

## 2023-11-04 MED ORDER — BENZONATATE 100 MG PO CAPS: 100.0000 mg | ORAL_CAPSULE | Freq: Three times a day (TID) | ORAL | 0 refills | Status: DC | PRN

## 2023-11-04 NOTE — Progress Notes (Signed)
 We are sorry that you are not feeling well.  Here is how we plan to help!  Based on your presentation I believe you most likely have A cough due to residual inflammation from recent illness.  I recommend that you keep up with daily allergy medication for now. You can use OTC Delsym or Mucinex-DM. I have prescribed a prescription cough medication, Tessalon, to take as well.   From your responses in the eVisit questionnaire you describe inflammation in the upper respiratory tract which is causing a significant cough.  This is commonly called Bronchitis and has four common causes:   Allergies Viral Infections Acid Reflux Bacterial Infection Allergies, viruses and acid reflux are treated by controlling symptoms or eliminating the cause. An example might be a cough caused by taking certain blood pressure medications. You stop the cough by changing the medication. Another example might be a cough caused by acid reflux. Controlling the reflux helps control the cough.  USE OF BRONCHODILATOR (RESCUE) INHALERS: There is a risk from using your bronchodilator too frequently.  The risk is that over-reliance on a medication which only relaxes the muscles surrounding the breathing tubes can reduce the effectiveness of medications prescribed to reduce swelling and congestion of the tubes themselves.  Although you feel brief relief from the bronchodilator inhaler, your asthma may actually be worsening with the tubes becoming more swollen and filled with mucus.  This can delay other crucial treatments, such as oral steroid medications. If you need to use a bronchodilator inhaler daily, several times per day, you should discuss this with your provider.  There are probably better treatments that could be used to keep your asthma under control.     HOME CARE Only take medications as instructed by your medical team. Complete the entire course of an antibiotic. Drink plenty of fluids and get plenty of rest. Avoid  close contacts especially the very young and the elderly Cover your mouth if you cough or cough into your sleeve. Always remember to wash your hands A steam or ultrasonic humidifier can help congestion.   GET HELP RIGHT AWAY IF: You develop worsening fever. You become short of breath You cough up blood. Your symptoms persist after you have completed your treatment plan MAKE SURE YOU  Understand these instructions. Will watch your condition. Will get help right away if you are not doing well or get worse.  Your e-visit answers were reviewed by a board certified advanced clinical practitioner to complete your personal care plan.  Depending on the condition, your plan could have included both over the counter or prescription medications. If there is a problem please reply  once you have received a response from your provider. Your safety is important to us .  If you have drug allergies check your prescription carefully.    You can use MyChart to ask questions about today's visit, request a non-urgent call back, or ask for a work or school excuse for 24 hours related to this e-Visit. If it has been greater than 24 hours you will need to follow up with your provider, or enter a new e-Visit to address those concerns. You will get an e-mail in the next two days asking about your experience.  I hope that your e-visit has been valuable and will speed your recovery. Thank you for using e-visits.   I have spent 8 minutes in review of e-visit questionnaire, review and updating patient chart, medical decision making and response to patient.   Elsie Velma Lunger,  PA-C

## 2023-11-04 NOTE — Progress Notes (Signed)
 Message sent to patient requesting further input regarding current symptoms. Awaiting patient response.

## 2023-11-04 NOTE — Telephone Encounter (Signed)
 Called to check on home blood pressure readings on hydrochlorothiazide  - lvm

## 2023-11-24 ENCOUNTER — Telehealth: Admitting: Physician Assistant

## 2023-11-24 DIAGNOSIS — J069 Acute upper respiratory infection, unspecified: Secondary | ICD-10-CM | POA: Diagnosis not present

## 2023-11-24 MED ORDER — PREDNISONE 10 MG (21) PO TBPK
ORAL_TABLET | ORAL | 0 refills | Status: AC
Start: 2023-11-24 — End: ?

## 2023-11-24 NOTE — Progress Notes (Signed)
 We are sorry that you are not feeling well.  Here is how we plan to help!  I have prescribed a prednisone  dose pack to take as directed, along with previous recommendation. If not resolving, or any new/worsening symptoms, you will need an in-person evaluation.    From your responses in the eVisit questionnaire you describe inflammation in the upper respiratory tract which is causing a significant cough.  This is commonly called Bronchitis and has four common causes:   Allergies Viral Infections Acid Reflux Bacterial Infection Allergies, viruses and acid reflux are treated by controlling symptoms or eliminating the cause. An example might be a cough caused by taking certain blood pressure medications. You stop the cough by changing the medication. Another example might be a cough caused by acid reflux. Controlling the reflux helps control the cough.  USE OF BRONCHODILATOR (RESCUE) INHALERS: There is a risk from using your bronchodilator too frequently.  The risk is that over-reliance on a medication which only relaxes the muscles surrounding the breathing tubes can reduce the effectiveness of medications prescribed to reduce swelling and congestion of the tubes themselves.  Although you feel brief relief from the bronchodilator inhaler, your asthma may actually be worsening with the tubes becoming more swollen and filled with mucus.  This can delay other crucial treatments, such as oral steroid medications. If you need to use a bronchodilator inhaler daily, several times per day, you should discuss this with your provider.  There are probably better treatments that could be used to keep your asthma under control.     HOME CARE Only take medications as instructed by your medical team. Complete the entire course of an antibiotic. Drink plenty of fluids and get plenty of rest. Avoid close contacts especially the very young and the elderly Cover your mouth if you cough or cough into your  sleeve. Always remember to wash your hands A steam or ultrasonic humidifier can help congestion.   GET HELP RIGHT AWAY IF: You develop worsening fever. You become short of breath You cough up blood. Your symptoms persist after you have completed your treatment plan MAKE SURE YOU  Understand these instructions. Will watch your condition. Will get help right away if you are not doing well or get worse.  Your e-visit answers were reviewed by a board certified advanced clinical practitioner to complete your personal care plan.  Depending on the condition, your plan could have included both over the counter or prescription medications. If there is a problem please reply  once you have received a response from your provider. Your safety is important to us .  If you have drug allergies check your prescription carefully.    You can use MyChart to ask questions about today's visit, request a non-urgent call back, or ask for a work or school excuse for 24 hours related to this e-Visit. If it has been greater than 24 hours you will need to follow up with your provider, or enter a new e-Visit to address those concerns. You will get an e-mail in the next two days asking about your experience.  I hope that your e-visit has been valuable and will speed your recovery. Thank you for using e-visits.   I have spent 5 minutes in review of e-visit questionnaire, review and updating patient chart, medical decision making and response to patient.   Elsie Velma Lunger, PA-C

## 2023-12-16 ENCOUNTER — Other Ambulatory Visit: Payer: Self-pay

## 2023-12-16 ENCOUNTER — Telehealth: Payer: Self-pay

## 2023-12-16 DIAGNOSIS — R252 Cramp and spasm: Secondary | ICD-10-CM

## 2023-12-16 NOTE — Telephone Encounter (Signed)
 Can he get a larger cuff for home?  His pressure would likely be better-have him update us  in another week once he gets that-very important make sure those bottom numbers trend down  Hydrochlorothiazide  can lower potassium which can cause cramping and can lower magnesium-you can order magnesium and BMP under muscle cramping and set him up for labs

## 2023-12-16 NOTE — Telephone Encounter (Signed)
-----   Message from Garnette Lukes sent at 12/02/2023  2:51 PM EST ----- Regarding: FW: bp See phone note 10/22/23- we need to try to connect again with him ----- Message ----- From: Lukes Garnette KIDD, MD Sent: 10/14/2023  12:00 AM EST To: Garnette KIDD Lukes, MD Subject: bp                                             Did I get his blood pressure readings? Blood pressure

## 2023-12-16 NOTE — Telephone Encounter (Signed)
 Called and spoke to patient and notified him of the notes per Dr. Katrinka. Patient expressed that is currently working on getting another cuff. Patient asked if Dr. Katrinka thinks he should take magnesium supplements. Also patient inquired to see if the leg cramps come from lack of hydration: specifically water. Please advise.  Labs have been put in. Patient expressed that he would call the office back to schedule the labs at a later time.

## 2023-12-18 ENCOUNTER — Telehealth: Payer: Self-pay

## 2023-12-18 NOTE — Telephone Encounter (Signed)
-----   Message from Garnette Lukes sent at 12/02/2023  2:51 PM EST ----- Regarding: FW: bp See phone note 10/22/23- we need to try to connect again with him ----- Message ----- From: Lukes Garnette KIDD, MD Sent: 10/14/2023  12:00 AM EST To: Garnette KIDD Lukes, MD Subject: bp                                             Did I get his blood pressure readings? Blood pressure

## 2023-12-23 NOTE — Telephone Encounter (Signed)
 Sent patient a MyChart message and left a VM requesting blood pressure reading.

## 2023-12-23 NOTE — Telephone Encounter (Signed)
 Team I cannot tell from this note if you called him-please call him

## 2024-01-06 NOTE — Telephone Encounter (Signed)
 Ok at this point lets just have the front desk call and state I strongly recommend in person follow up to recheck blood pressure since we have not heard back from him on readings

## 2024-01-06 NOTE — Telephone Encounter (Signed)
 LVM to schedule Office visit with Dr. Katrinka for BP Follow up

## 2024-01-24 ENCOUNTER — Telehealth: Admitting: Family

## 2024-01-24 DIAGNOSIS — R6889 Other general symptoms and signs: Secondary | ICD-10-CM | POA: Diagnosis not present

## 2024-01-24 MED ORDER — BENZONATATE 100 MG PO CAPS: 100.0000 mg | ORAL_CAPSULE | Freq: Two times a day (BID) | ORAL | 0 refills | Status: AC | PRN

## 2024-01-24 MED ORDER — PROMETHAZINE-DM 6.25-15 MG/5ML PO SYRP: 5.0000 mL | ORAL_SOLUTION | Freq: Three times a day (TID) | ORAL | 0 refills | Status: AC | PRN

## 2024-01-24 NOTE — Progress Notes (Signed)
 E visit for Flu like symptoms   We are sorry that you are not feeling well.  Here is how we plan to help! Based on what you have shared with me it looks like you may have a respiratory virus that may be influenza.  Influenza or the flu is  an infection caused by a respiratory virus. The flu virus is highly contagious and persons who did not receive their yearly flu vaccination may catch the flu from close contact.  We have anti-viral medications to treat the viruses that cause this infection. They are not a cure and only shorten the course of the infection. These prescriptions are most effective when they are given within the first 2 days of flu symptoms. Antiviral medications are indicated if you have a high risk of complications from the flu. You should  also consider an antiviral medication if you are in close contact with someone who is at risk. These medications can help patients avoid complications from the flu but have side effects that you should know.   Possible side effects from Tamiflu or oseltamivir include nausea, vomiting, diarrhea, dizziness, headaches, eye redness, sleep problems or other respiratory symptoms. You should not take Tamiflu if you have an allergy to oseltamivir or any to the ingredients in Tamiflu.  For nasal congestion, you may use an oral decongestant such as Mucinex  D or if you have glaucoma or high blood pressure use plain Mucinex .  Saline nasal spray or nasal drops can help and can safely be used as often as needed for congestion.  If you have a sore or scratchy throat, use a saltwater gargle-  to  teaspoon of salt dissolved in a 4-ounce to 8-ounce glass of warm water.  Gargle the solution for approximately 15-30 seconds and then spit.  It is important not to swallow the solution.  You can also use throat lozenges/cough drops and Chloraseptic spray to help with throat pain or discomfort.  Warm or cold liquids can also be helpful in relieving throat  pain.  For headache, pain or general discomfort, you can use Ibuprofen  or Tylenol  as directed.   Some authorities believe that zinc sprays or the use of Echinacea may shorten the course of your symptoms.  I have prescribed the following medications to help lessen symptoms: I have prescribed Tessalon  Perles 100 mg. You may take 1-2 capsules every 8 hours as needed for cough and I have prescribed Phenergan  DM 6.25 mg/15 mg. You make take one teaspoon / 5 ml every 4-6 hours as needed for cough  You are to isolate at home until you have been fever-free for at least 24 hours without a fever-reducing medication, and symptoms have been steadily improving for 24 hours.  If you must be around other household members who do not have symptoms, you need to make sure that both you and the family members are masking consistently with a high-quality mask.  If you note any worsening of symptoms despite treatment, please seek an in-person evaluation ASAP. If you note any significant shortness of breath or any chest pain, please seek ED evaluation. Please do not delay care!  ANYONE WHO HAS FLU SYMPTOMS SHOULD: Stay home. The flu is highly contagious and going out or to work exposes others! Be sure to drink plenty of fluids. Water is fine as well as fruit juices, sodas and electrolyte beverages. You may want to stay away from caffeine or alcohol. If you are nauseated, try taking small sips of liquids. How do  you know if you are getting enough fluid? Your urine should be a pale yellow or almost colorless. Get rest. Taking a steamy shower or using a humidifier may help nasal congestion and ease sore throat pain. Using a saline nasal spray works much the same way. Cough drops, hard candies and sore throat lozenges may ease your cough. Line up a caregiver. Have someone check on you regularly.  GET HELP RIGHT AWAY IF: You cannot keep down liquids or your medications. You become short of breath Your fell like you are  going to pass out or loose consciousness. Your symptoms persist after you have completed your treatment plan  MAKE SURE YOU  Understand these instructions. Will watch your condition. Will get help right away if you are not doing well or get worse.  Your e-visit answers were reviewed by a board certified advanced clinical practitioner to complete your personal care plan.  Depending on the condition, your plan could have included both over the counter or prescription medications.  If there is a problem please reply  once you have received a response from your provider.  Your safety is important to us .  If you have drug allergies check your prescription carefully.    You can use MyChart to ask questions about todays visit, request a non-urgent call back, or ask for a work or school excuse for 24 hours related to this e-Visit. If it has been greater than 24 hours you will need to follow up with your provider, or enter a new e-Visit to address those concerns.  You will get an e-mail in the next two days asking about your experience.  I hope that your e-visit has been valuable and will speed your recovery. Thank you for using e-visits.   I have spent 5 minutes in review of e-visit questionnaire, review and updating patient chart, medical decision making and response to patient.   Bari Learn, FNP

## 2024-03-01 NOTE — Progress Notes (Signed)
 Lenord Heikes                                          MRN: [id number]   03/01/2024   The [facility] Quality Team Specialist reviewed this patient medical record for the purposes of chart review for care gap closure. The following were reviewed: abstraction for care gap closure-kidney health evaluation for diabetes:eGFR  and uACR.    [facility] Quality Team
